# Patient Record
Sex: Male | Born: 1980 | Race: Black or African American | Hispanic: No | Marital: Single | Smoking: Never smoker
Health system: Southern US, Community
[De-identification: ages and names within clinical notes are randomized; demographics above are authoritative.]

## PROBLEM LIST (undated history)

## (undated) DIAGNOSIS — I509 Heart failure, unspecified: Secondary | ICD-10-CM

## (undated) DIAGNOSIS — I890 Lymphedema, not elsewhere classified: Secondary | ICD-10-CM

## (undated) DIAGNOSIS — Z973 Presence of spectacles and contact lenses: Secondary | ICD-10-CM

## (undated) DIAGNOSIS — G4733 Obstructive sleep apnea (adult) (pediatric): Secondary | ICD-10-CM

## (undated) DIAGNOSIS — I517 Cardiomegaly: Secondary | ICD-10-CM

## (undated) DIAGNOSIS — I872 Venous insufficiency (chronic) (peripheral): Secondary | ICD-10-CM

## (undated) DIAGNOSIS — J9690 Respiratory failure, unspecified, unspecified whether with hypoxia or hypercapnia: Secondary | ICD-10-CM

## (undated) DIAGNOSIS — I10 Essential (primary) hypertension: Secondary | ICD-10-CM

## (undated) HISTORY — PX: TRACHEOSTOMY: SUR1362

## (undated) HISTORY — DX: Heart failure, unspecified: I50.9

## (undated) HISTORY — DX: Venous insufficiency (chronic) (peripheral): I87.2

## (undated) HISTORY — DX: Obstructive sleep apnea (adult) (pediatric): G47.33

## (undated) HISTORY — DX: Cardiomegaly: I51.7

## (undated) HISTORY — DX: Presence of spectacles and contact lenses: Z97.3

## (undated) HISTORY — DX: Morbid (severe) obesity due to excess calories: E66.01

## (undated) HISTORY — DX: Lymphedema, not elsewhere classified: I89.0

---
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
## 2006-02-26 ENCOUNTER — Emergency Department (HOSPITAL_COMMUNITY): Admission: EM | Admit: 2006-02-26 | Discharge: 2006-02-26 | Payer: Self-pay | Admitting: Emergency Medicine

## 2006-02-26 ENCOUNTER — Encounter: Payer: Self-pay | Admitting: Vascular Surgery

## 2008-08-22 ENCOUNTER — Encounter (INDEPENDENT_AMBULATORY_CARE_PROVIDER_SITE_OTHER): Payer: Self-pay | Admitting: *Deleted

## 2008-08-22 ENCOUNTER — Inpatient Hospital Stay (HOSPITAL_COMMUNITY): Admission: AD | Admit: 2008-08-22 | Discharge: 2008-10-18 | Payer: Self-pay | Admitting: *Deleted

## 2008-08-22 ENCOUNTER — Ambulatory Visit: Payer: Self-pay | Admitting: Pulmonary Disease

## 2008-08-26 ENCOUNTER — Encounter (INDEPENDENT_AMBULATORY_CARE_PROVIDER_SITE_OTHER): Payer: Self-pay | Admitting: *Deleted

## 2008-09-02 DIAGNOSIS — J9690 Respiratory failure, unspecified, unspecified whether with hypoxia or hypercapnia: Secondary | ICD-10-CM

## 2008-09-02 DIAGNOSIS — I509 Heart failure, unspecified: Secondary | ICD-10-CM

## 2008-09-02 HISTORY — DX: Heart failure, unspecified: I50.9

## 2008-09-02 HISTORY — DX: Respiratory failure, unspecified, unspecified whether with hypoxia or hypercapnia: J96.90

## 2008-09-16 ENCOUNTER — Ambulatory Visit: Payer: Self-pay | Admitting: Physical Medicine & Rehabilitation

## 2008-09-24 ENCOUNTER — Ambulatory Visit: Payer: Self-pay | Admitting: Pulmonary Disease

## 2008-09-30 ENCOUNTER — Ambulatory Visit: Payer: Self-pay | Admitting: Vascular Surgery

## 2008-09-30 ENCOUNTER — Encounter (INDEPENDENT_AMBULATORY_CARE_PROVIDER_SITE_OTHER): Payer: Self-pay | Admitting: Pulmonary Disease

## 2008-10-28 ENCOUNTER — Ambulatory Visit: Payer: Self-pay | Admitting: Pulmonary Disease

## 2008-10-28 DIAGNOSIS — J9610 Chronic respiratory failure, unspecified whether with hypoxia or hypercapnia: Secondary | ICD-10-CM

## 2008-11-27 ENCOUNTER — Ambulatory Visit: Payer: Self-pay | Admitting: Surgery

## 2009-02-25 ENCOUNTER — Ambulatory Visit: Payer: Self-pay | Admitting: Pulmonary Disease

## 2009-02-26 LAB — CONVERTED CEMR LAB
Basophils Absolute: 0.1 10*3/uL (ref 0.0–0.1)
Basophils Relative: 0.9 % (ref 0.0–3.0)
CO2: 32 meq/L (ref 19–32)
Eosinophils Absolute: 0.2 10*3/uL (ref 0.0–0.7)
Glucose, Bld: 87 mg/dL (ref 70–99)
HCT: 40 % (ref 39.0–52.0)
Hemoglobin: 13.8 g/dL (ref 13.0–17.0)
Lymphs Abs: 1.6 10*3/uL (ref 0.7–4.0)
MCHC: 34.4 g/dL (ref 30.0–36.0)
Monocytes Relative: 12.3 % — ABNORMAL HIGH (ref 3.0–12.0)
Neutro Abs: 4.4 10*3/uL (ref 1.4–7.7)
Potassium: 3.3 meq/L — ABNORMAL LOW (ref 3.5–5.1)
RDW: 12.4 % (ref 11.5–14.6)
Sodium: 140 meq/L (ref 135–145)

## 2009-03-02 ENCOUNTER — Encounter: Payer: Self-pay | Admitting: Pulmonary Disease

## 2009-03-03 ENCOUNTER — Encounter: Payer: Self-pay | Admitting: Pulmonary Disease

## 2009-03-04 ENCOUNTER — Telehealth: Payer: Self-pay | Admitting: Pulmonary Disease

## 2009-03-11 ENCOUNTER — Encounter: Payer: Self-pay | Admitting: Pulmonary Disease

## 2009-03-11 ENCOUNTER — Telehealth: Payer: Self-pay | Admitting: Pulmonary Disease

## 2009-05-25 ENCOUNTER — Encounter: Payer: Self-pay | Admitting: Pulmonary Disease

## 2009-06-15 ENCOUNTER — Encounter: Payer: Self-pay | Admitting: Pulmonary Disease

## 2009-06-24 ENCOUNTER — Encounter: Payer: Self-pay | Admitting: Pulmonary Disease

## 2009-09-28 ENCOUNTER — Encounter: Payer: Self-pay | Admitting: Pulmonary Disease

## 2009-09-30 ENCOUNTER — Encounter: Payer: Self-pay | Admitting: Pulmonary Disease

## 2009-11-11 ENCOUNTER — Encounter: Payer: Self-pay | Admitting: Pulmonary Disease

## 2009-11-19 ENCOUNTER — Telehealth: Payer: Self-pay | Admitting: Pulmonary Disease

## 2009-11-25 ENCOUNTER — Ambulatory Visit: Payer: Self-pay | Admitting: Pulmonary Disease

## 2009-11-29 ENCOUNTER — Encounter: Payer: Self-pay | Admitting: Pulmonary Disease

## 2009-12-04 ENCOUNTER — Encounter: Payer: Self-pay | Admitting: Pulmonary Disease

## 2010-01-01 ENCOUNTER — Encounter: Payer: Self-pay | Admitting: Pulmonary Disease

## 2010-01-26 ENCOUNTER — Telehealth (INDEPENDENT_AMBULATORY_CARE_PROVIDER_SITE_OTHER): Payer: Self-pay | Admitting: *Deleted

## 2010-02-04 ENCOUNTER — Ambulatory Visit: Payer: Self-pay | Admitting: Pulmonary Disease

## 2010-02-10 ENCOUNTER — Telehealth: Payer: Self-pay | Admitting: Pulmonary Disease

## 2010-02-18 ENCOUNTER — Encounter: Payer: Self-pay | Admitting: Pulmonary Disease

## 2010-02-26 ENCOUNTER — Encounter: Payer: Self-pay | Admitting: Pulmonary Disease

## 2010-04-08 ENCOUNTER — Ambulatory Visit (HOSPITAL_BASED_OUTPATIENT_CLINIC_OR_DEPARTMENT_OTHER)
Admission: RE | Admit: 2010-04-08 | Discharge: 2010-04-08 | Payer: Self-pay | Source: Home / Self Care | Admitting: Pulmonary Disease

## 2010-04-08 ENCOUNTER — Encounter: Payer: Self-pay | Admitting: Pulmonary Disease

## 2010-04-08 DIAGNOSIS — G4733 Obstructive sleep apnea (adult) (pediatric): Secondary | ICD-10-CM | POA: Insufficient documentation

## 2010-04-15 ENCOUNTER — Telehealth: Payer: Self-pay | Admitting: Pulmonary Disease

## 2010-05-27 IMAGING — CR DG CHEST 1V PORT
1 series · 1 of 1 positions shown · non-contrast
Comparison: 08/28/2008

CLINICAL DATA: Hypoxia.  Chest pain.

PORTABLE CHEST - 1 VIEW

[AP]
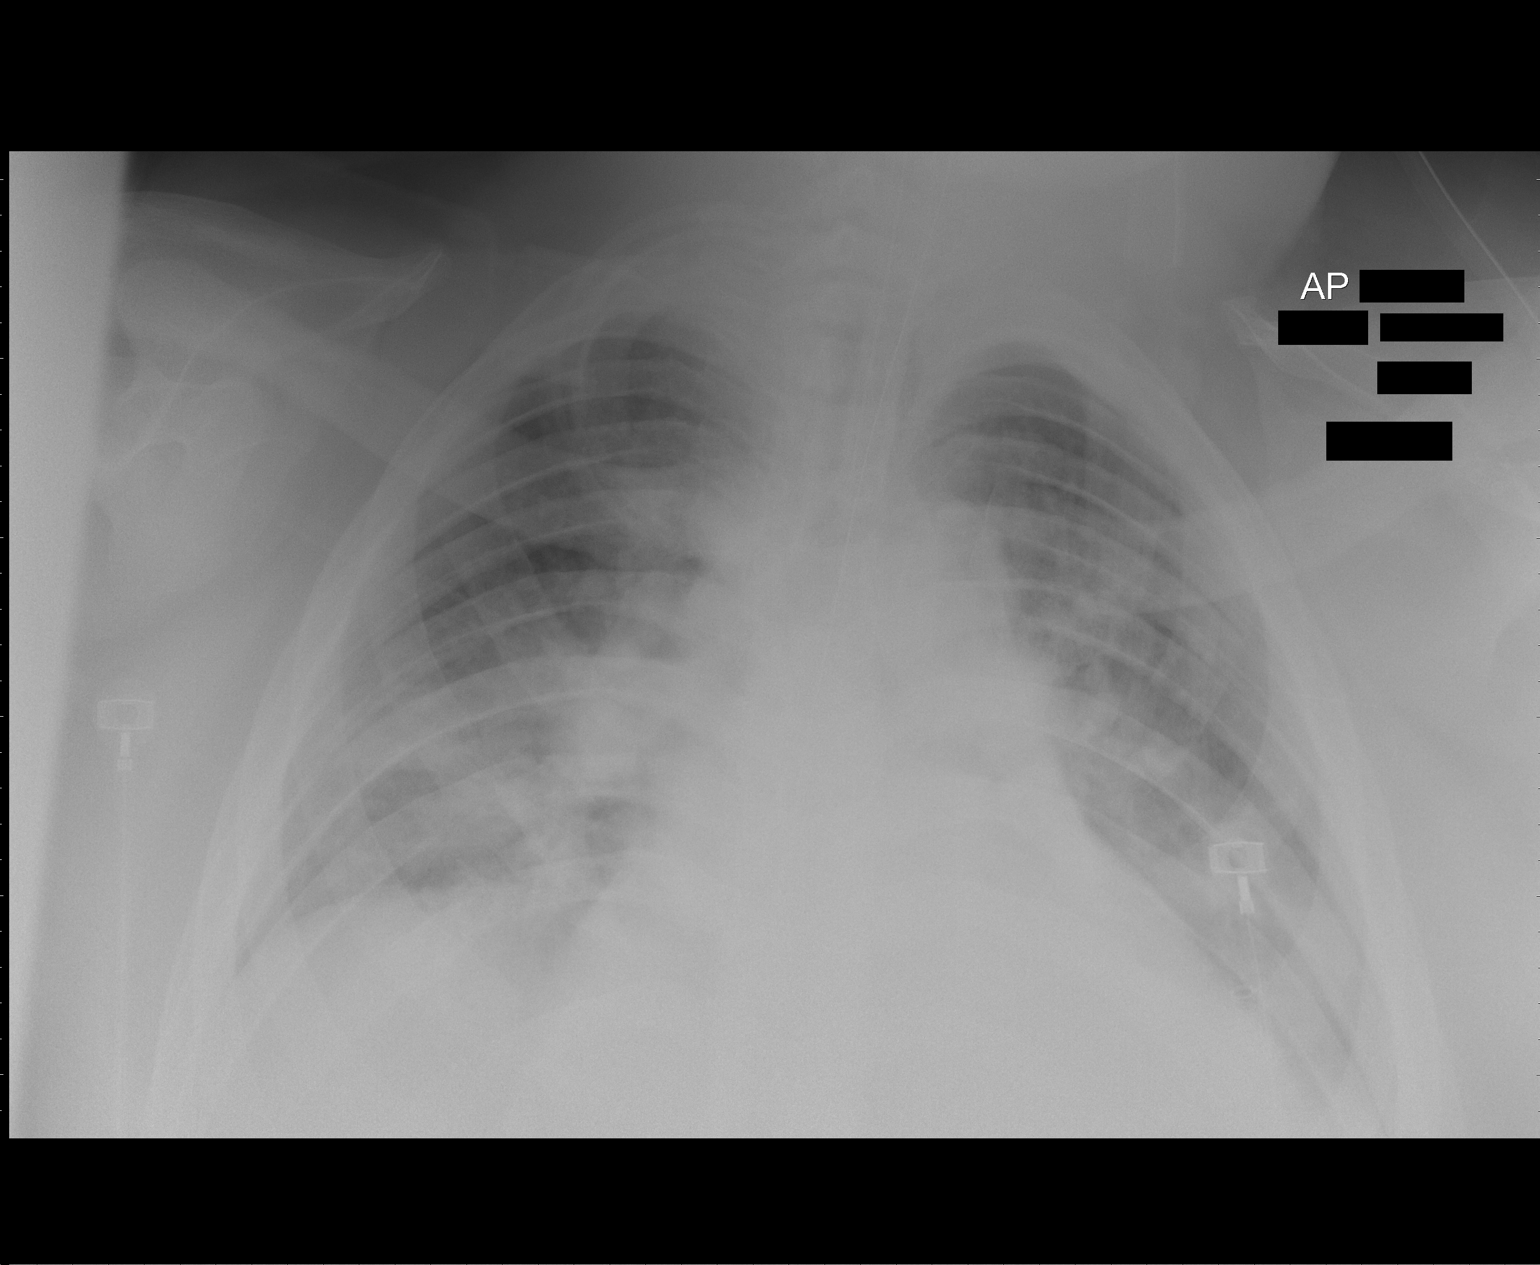

[1 of 1 positions shown; findings below may reference images not displayed]

FINDINGS: Cardiomegaly.  Bilateral patchy lung densities compatible
with atelectasis and possibly some edema.  Left lower lobe
atelectasis / consolidation.  Overall lung aeration minimally
improved.  Satisfactory ET tube position.  NG tube is noted
traversing esophagus.
IMPRESSION: Minimal improved aeration of the lungs.  Cardiomegaly.

## 2010-05-28 IMAGING — CR DG CHEST 1V PORT
1 series · 1 of 1 positions shown · non-contrast
Comparison: 08/29/2008

CLINICAL DATA: Chest pain

PORTABLE CHEST - 1 VIEW

[AP]
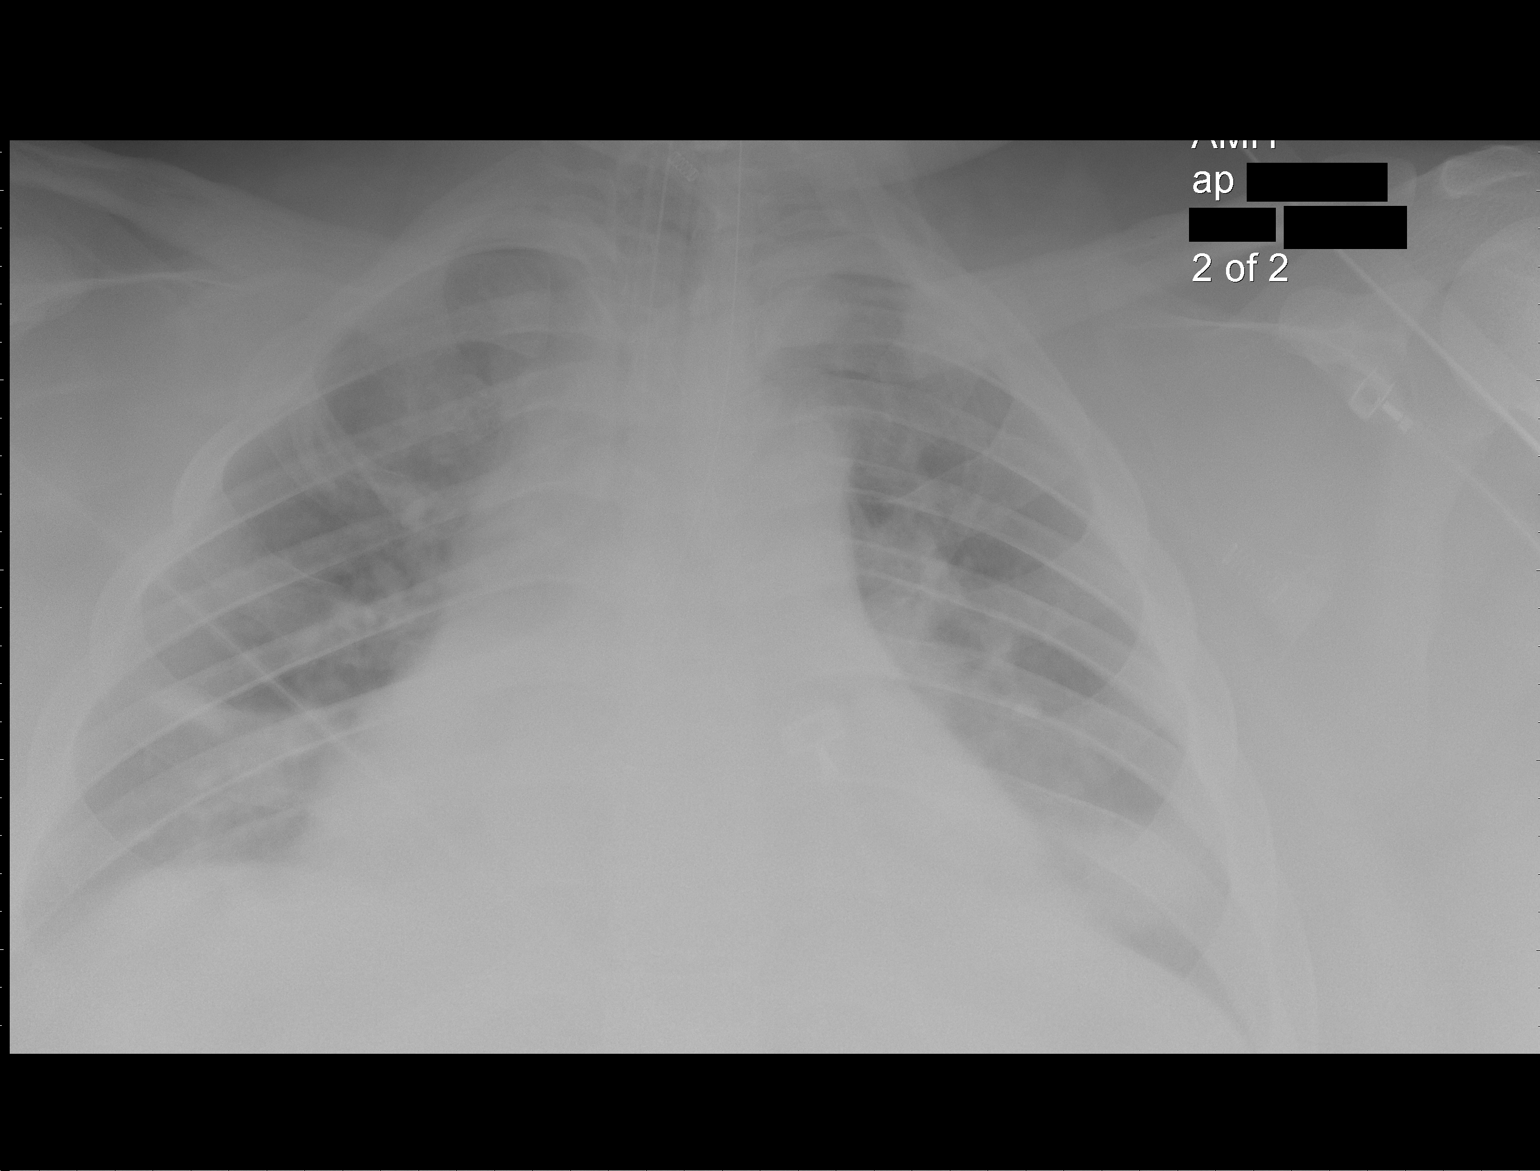

[1 of 1 positions shown; findings below may reference images not displayed]

FINDINGS: Examination is suboptimal due to the patient's body habitus and low
lung volumes. There is an ET tube, the tip appears to be just above
the carina.

A nasogastric tube is identified, I cannot confirm the position of
the tip of the nasogastric tube because of patient's body habitus.

The lung volumes appear lobe.

There is atelectasis in the lung bases.
IMPRESSION: 1.  Poor quality examination that due to the patient's morbid
obesity.
2.  Low lung volumes and bibasilar atelectasis.

## 2010-05-31 IMAGING — CR DG CHEST 1V PORT
1 series · 1 of 1 positions shown · non-contrast
Comparison: Earlier film of the same day

CLINICAL DATA: Chest pain, PICC placement

PORTABLE CHEST - 1 VIEW

[view not recorded]
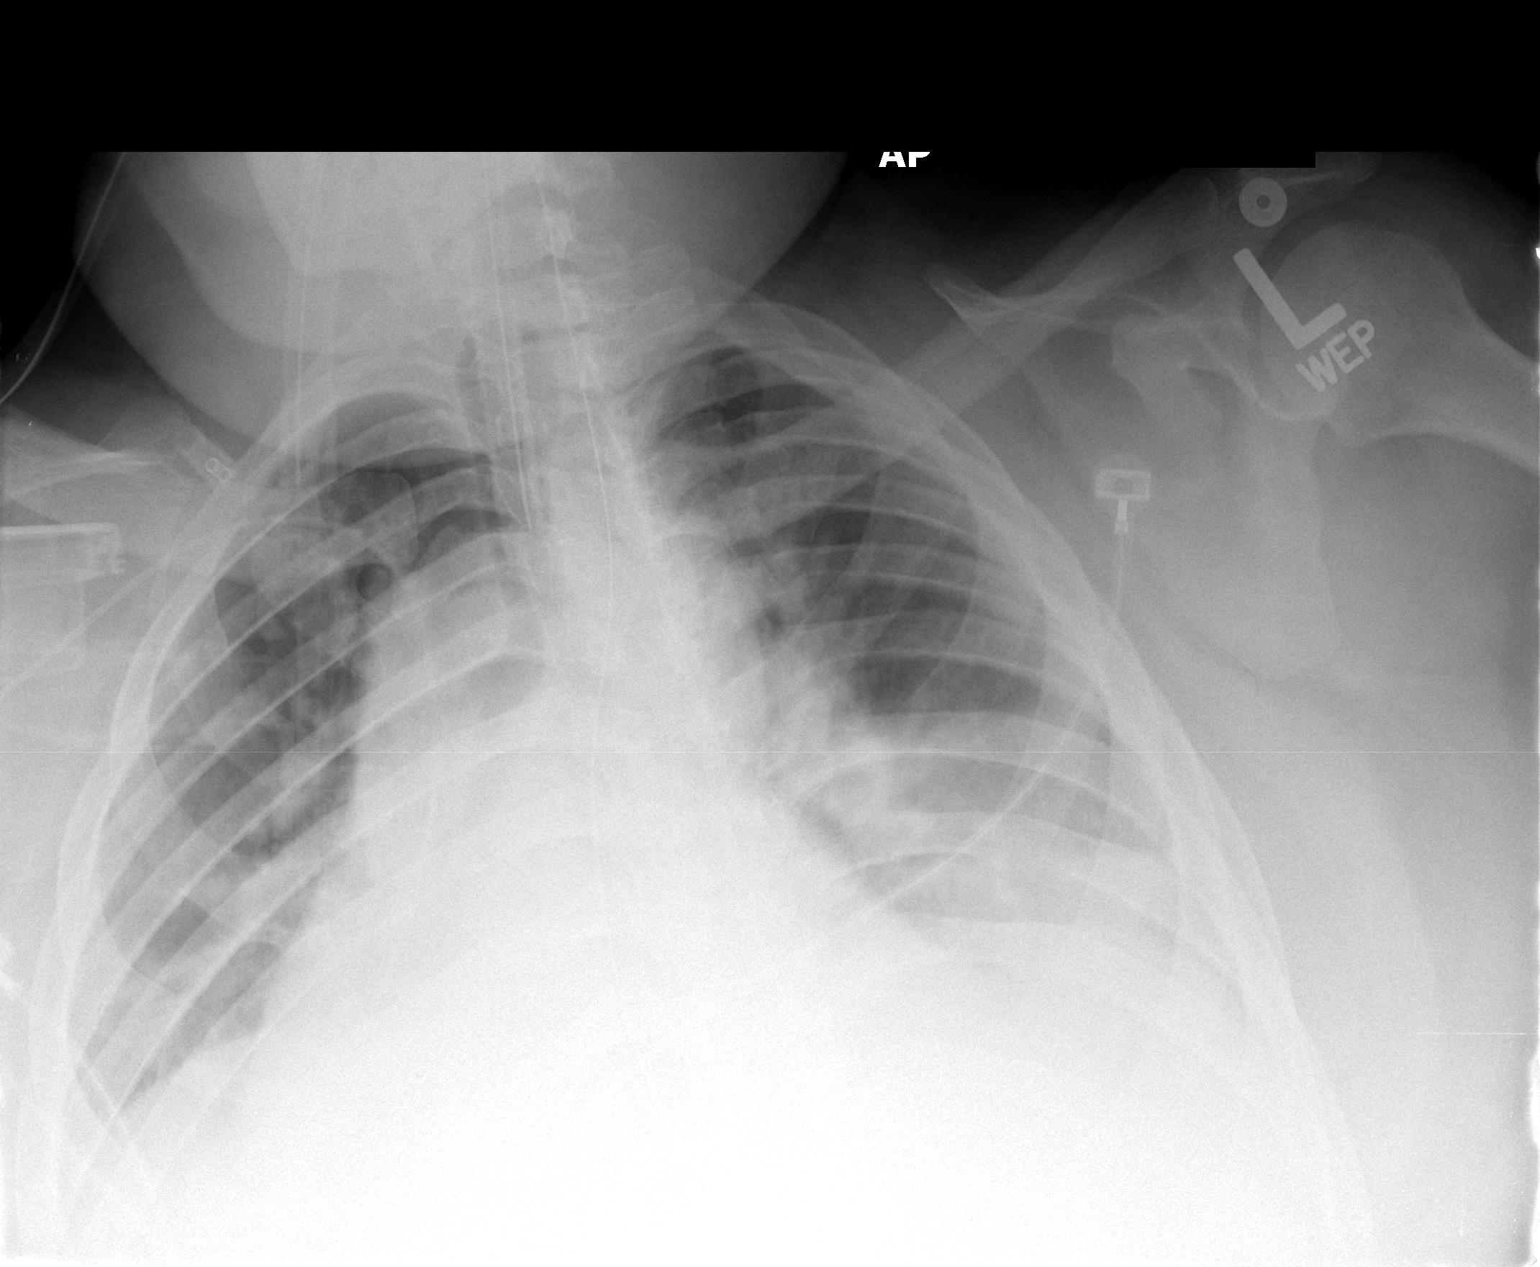

[1 of 1 positions shown; findings below may reference images not displayed]

FINDINGS: Right arm PICC line extends to the cavoatrial junction.
Endotracheal tube and nasogastric tube stable in position.  Low
lung volumes with some increase in bibasilar atelectasis or
consolidation.  Heart size upper limits normal for technique.
Cannot exclude small pleural effusions.
IMPRESSION: 1.  PICC line to cavoatrial junction.
2.  Some interval increase in bibasilar atelectasis or
consolidation.

## 2010-06-02 IMAGING — CR DG CHEST 1V PORT
1 series · 1 of 1 positions shown · non-contrast
Comparison: 09/03/2008

CLINICAL DATA: Hypoxia, chest pain.

PORTABLE CHEST - 1 VIEW

[AP]
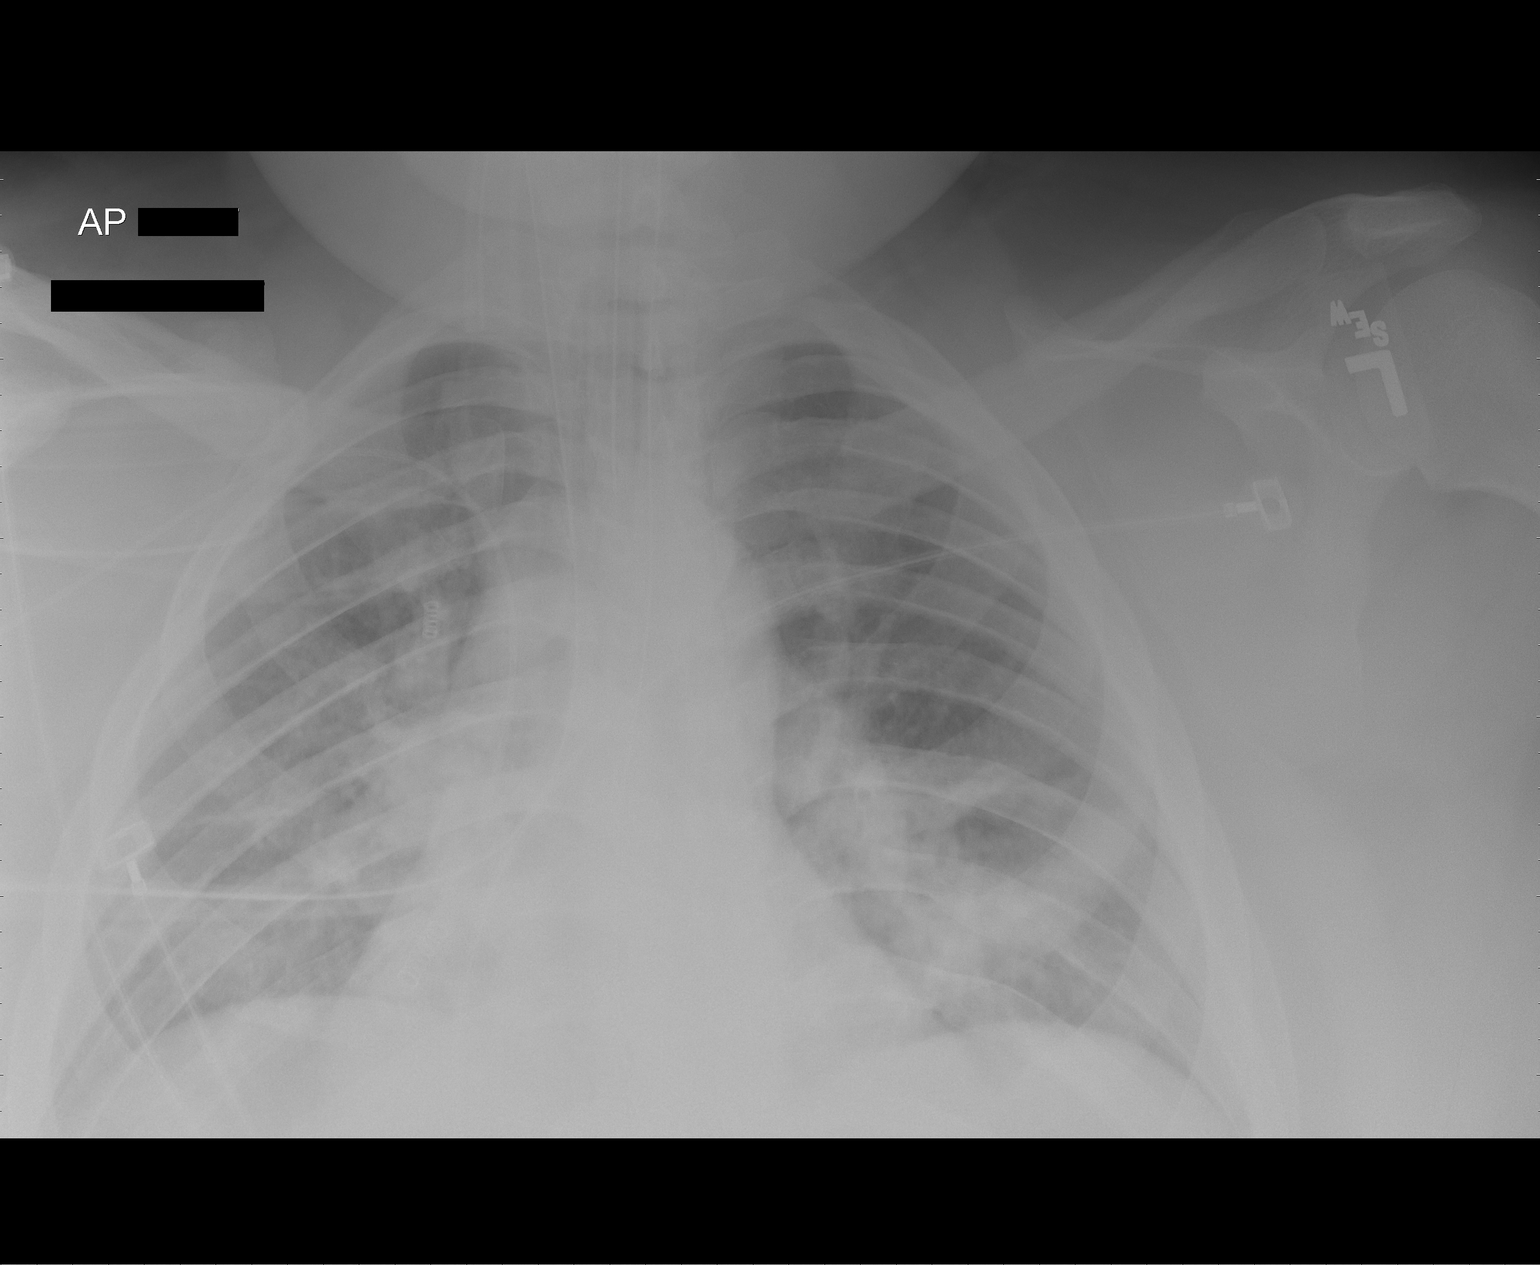

[1 of 1 positions shown; findings below may reference images not displayed]

FINDINGS: Support devices are unchanged.  There is cardiomegaly
with bilateral airspace disease, likely edema.  Slight improvement
in aeration in the lung bases.  No definite effusions.
IMPRESSION: Continued edema.  Slight improved aeration in the bases.

## 2010-06-07 ENCOUNTER — Ambulatory Visit
Admission: RE | Admit: 2010-06-07 | Discharge: 2010-06-07 | Payer: Self-pay | Source: Home / Self Care | Attending: Pulmonary Disease | Admitting: Pulmonary Disease

## 2010-06-10 NOTE — Progress Notes (Signed)
Summary: ONO results  Phone Note Call from Patient   Caller: [email or web address]  Call For: SOOD Summary of Call: pt hasn't been using ventolator about 3mos abd ono shows very little desats what do you want to do Initial call taken by: Oneita Jolly,  January 26, 2010 12:37 PM  Follow-up for Phone Call        He was supposed to schedule an ROV to discuss.  Can we get him scheduled for next available ROV.  Follow-up by: Coralyn Helling MD,  January 26, 2010 2:22 PM  Additional Follow-up for Phone Call Additional follow up Details #1::        Patient is sch fof follow-up on 02/04/2010 @ 4:30pm with Dr. Craige Cotta. A letter had been mailed to the pt and he says he forgot to callback and sch his follow-up. Libby, were ONO results faxed over for Dr. Craige Cotta to review?Michel Bickers [facility]  January 26, 2010 2:48 PM    Additional Follow-up for Phone Call Additional follow up Details #2::    ono will be refaxed and DR V can let [facility] know after his appt 02/04/10 what to do  Follow-up by: Oneita Jolly,  January 26, 2010 3:08 PM  Additional Follow-up for Phone Call Additional follow up Details #3:: Details for Additional Follow-up Action Taken: ONO received and will hold for pt appt with VS on 02/04/2010. Additional Follow-up by: Michel Bickers CMA,  January 26, 2010 4:47 PM

## 2010-06-10 NOTE — Letter (Signed)
SummaryScience writer Pulmonary Care Appointment Letter  [facility]  [address]   [place], [place] [address]   Phone: [phone]  Fax: [phone]    01/01/2010 MRN: [id number]  Tony Mccullough [address] [place], [place]  [address]  Dear [name],   Our office is attempting to contact you about an appointment.  Please call our office at [phone] to schedule this appointment with Dr.___Sood____.  Our registration staff is prepared to assist you with any questions you may have.    Thank you,   Nature conservation officer Pulmonary Division

## 2010-06-10 NOTE — Miscellaneous (Signed)
Summary: Respiratory Equip/[facility]  Respiratory Equip/[facility]   Imported By: Sherian Rein 10/01/2009 15:01:48  _____________________________________________________________________  External Attachment:    Type:   Image     Comment:   External Document

## 2010-06-10 NOTE — Miscellaneous (Signed)
Summary: Request an overnight oximetry/[facility]  Request an overnight oximetry/[facility]   Imported By: Sherian Rein 11/23/2009 11:08:31  _____________________________________________________________________  External Attachment:    Type:   Image     Comment:   External Document

## 2010-06-10 NOTE — Letter (Signed)
Summary: CMN for [facility]  CMN for [facility]   Imported By: Sherian Rein 12/01/2009 08:29:42  _____________________________________________________________________  External Attachment:    Type:   Image     Comment:   External Document

## 2010-06-10 NOTE — Miscellaneous (Signed)
Summary: Plan of Treatment/[facility]  Plan of Treatment/[facility]   Imported By: Sherian Rein 10/09/2009 09:15:55  _____________________________________________________________________  External Attachment:    Type:   Image     Comment:   External Document

## 2010-06-10 NOTE — Progress Notes (Signed)
  Phone Note Call from Patient   Caller: [email or web address]  Call For: sood Summary of Call: need an order to dc vent if pt is going on cpap Initial call taken by: Oneita Jolly,  April 15, 2010 10:32 AM

## 2010-06-10 NOTE — Miscellaneous (Signed)
Summary: Oxygen report  Clinical Lists Changes Oxygen report at rest and with exertion from 02/18/10.  Performed on room air with tracheostomy capped.  Minimal oxygen level 95%.    Results discussed with pt over the phone.  Will discuss results with Dr. Suszanne Conners, and decide about how to further proceed with decannulation and arranging for PAP therapy for his sleep apnea.  Appended Document: Oxygen report Discussed results of oxygen test with Dr. Suszanne Conners.  No contra-indications with plan to do PAP titration and if okay proceed with decannulation.  Plan d/w pt over phone.  Will arrange for PAP titration with trach capped.   Clinical Lists Changes  Orders: Added new Referral order of DME Referral (DME) - Signed

## 2010-06-10 NOTE — Procedures (Signed)
Summary: Oximetry/[facility]  Oximetry/[facility]   Imported By: Sherian Rein 01/04/2010 11:45:49  _____________________________________________________________________  External Attachment:    Type:   Image     Comment:   External Document

## 2010-06-10 NOTE — Progress Notes (Signed)
  Phone Note From Other Clinic   Caller: [facility] Summary of Call: Received request to have ONO done on room air off ventilator.  Will send order through [facility]. Initial call taken by: Coralyn Helling MD,  November 19, 2009 2:00 PM

## 2010-06-10 NOTE — Letter (Signed)
Summary: CMN/[facility]  CMN/[facility]   Imported By: Lester Goodridge 06/22/2009 07:32:05  _____________________________________________________________________  External Attachment:    Type:   Image     Comment:   External Document

## 2010-06-10 NOTE — Assessment & Plan Note (Signed)
Summary: ROV/LC   Visit Type:  Follow-up Copy to:  Newman Pies Primary Provider/Referring Provider:  Peter Swaziland  CC:  Patient is here for follow-up...discuss ONO...no complaints today.  History of Present Illness: 30 yo follow up for his chronic respiratory failure.  He has been doing well.  He has not been using his home vent for the last several months.  He still has his trach.  He has been working 3rd shift.  He gets about 6 hours sleep per day.  He feels okay after waking up.  He is not sleepy during his work shift.  He gets occasional cough.  Otherwise he denies wheeze, sputum, hemoptysis, or chest pain.  He last saw ENT in June and had his trach changed then.  He has lost 40 lbs since June 2010.  Recent overnight oximetry which trach on room air showed minimal oxygen desaturation.  He has a new cell number: [phone].  Current Medications (verified): 1)  Coreg 6.25 Mg Tabs (Carvedilol) .... One Tab Twice A Day 2)  Lasix 40 Mg Tabs (Furosemide) .Marland Kitchen.. 1 Tab Two Times A Day 3)  Albuterol Sulfate (2.5 Mg/10ml) 0.083% Nebu (Albuterol Sulfate) .... As Needed  Allergies (verified): No Known Drug Allergies  Past History:  Past Surgical History: Last updated: 10/28/2008 Tracheostomy September 04, 2008 by Dr. Newman Pies  Past Medical History: Chronic hypoxic and hypercapneic respiratory failure      - s/p tracheostomy Morbid obesity Hypertension Grade 2 diastolic dysfunction      - Echo August 26, 2008 EF 65% Diabetes mellitus Right upper arm DVT 09/30/08  Vital Signs:  Patient profile:   30 year old male Height:      73 inches (185.42 cm) Weight:      368 pounds (167.27 kg) BMI:     48.73 O2 Sat:      97 % on Room air Temp:     97.9 degrees F (36.61 degrees C) oral Pulse rate:   78 / minute BP sitting:   132 / 84  (left arm) Cuff size:   large  Vitals Entered By: Michel Bickers CMA (February 04, 2010 4:44 PM)  O2 Sat at Rest %:  97 O2 Flow:  Room air CC: Patient is here for  follow-up...discuss ONO...no complaints today Comments Medications reviewed with patient Michel Bickers [facility]  February 04, 2010 4:45 PM   Physical Exam  General:  healthy appearing and obese.   Nose:  no deformity, discharge, inflammation, or lesions Mouth:  MP 4, no oral lesion Neck:  trach site clean Lungs:  decreased breath sounds, no wheezing Heart:  regular rate and rhythm, S1, S2 without murmurs, rubs, gallops, or clicks Extremities:  no clubbing, cyanosis, edema, or deformity noted Neurologic:  normal CN II-XII and strength normal.   Cervical Nodes:  no significant adenopathy Psych:  alert and cooperative; normal mood and affect; normal attention span and concentration   Impression & Recommendations:  Problem # 1:  CHRONIC RESPIRATORY FAILURE (QIH-474.25) He has improved.  WIll see if he can get set up with CPAP/BPAP.  If he is able to do this he may be able to get decannulation of tracheostomy.  Problem # 2:  MORBID OBESITY (ICD-278.01) Encouraged him to continue with weight loss.  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  Complete Medication List: 1)  Coreg 6.25 Mg Tabs (Carvedilol) .... One tab twice a day 2)  Lasix 40  Mg Tabs (Furosemide) .Marland Kitchen.. 1 tab two times a day 3)  Albuterol Sulfate (2.5 Mg/30ml) 0.083% Nebu (Albuterol sulfate) .... As needed  Other Orders: Est. Patient Level III (413[address])  Patient Instructions: 1)  Will discuss tracheostomy with Dr. Suszanne Conners 2)  Follow up in 2 months

## 2010-06-10 NOTE — Letter (Signed)
Summary: DME/[facility]  DME/[facility]   Imported By: Lester Everly 06/22/2009 07:29:42  _____________________________________________________________________  External Attachment:    Type:   Image     Comment:   External Document

## 2010-06-10 NOTE — Assessment & Plan Note (Signed)
Summary: rov/mbw   Copy to:  Newman Pies Primary Provider/Referring Provider:  Peter Swaziland  CC:  Pt would like to see about getting the trach out, pt states he has no concerns, and overall pt states he is doing fine.  History of Present Illness: 30 yo follow up for his chronic respiratory failure.  He is using his ventilator about once per week.  He usually sleeps with his trach uncapped, but off the vent.  He feels he has been doing okay.  He is working third shift.  He continues to exercise at the gym, and has been modifying his diet.  His weight has been steady.  He was recently seen by ENT and had his trach changed.   Preventive Screening-Counseling & Management  Alcohol-Tobacco     Smoking Status: never  Current Medications (verified): 1)  Coreg 6.25 Mg Tabs (Carvedilol) .... One Tab Twice A Day 2)  Lasix 40 Mg Tabs (Furosemide) .Marland Kitchen.. 1 Tab Two Times A Day 3)  Albuterol Sulfate (2.5 Mg/42ml) 0.083% Nebu (Albuterol Sulfate) .... As Needed  Allergies (verified): No Known Drug Allergies  Past History:  Past Medical History: Reviewed history from 10/28/2008 and no changes required. Chronic hypoxic and hypercapneic respiratory failure      - s/p tracheostomy and nocturnal ventilation Morbid obesity Hypertension Grade 2 diastolic dysfunction      - Echo August 26, 2008 EF 65% Diabetes mellitus Right upper arm DVT 09/30/08  Past Surgical History: Reviewed history from 10/28/2008 and no changes required. Tracheostomy September 04, 2008 by Dr. Newman Pies  Vital Signs:  Patient profile:   30 year old male Height:      73 inches Weight:      372.4 pounds BMI:     49.31 O2 Sat:      94 % on Room air Temp:     98.4 degrees F oral Pulse rate:   81 / minute BP sitting:   130 / 70  (left arm) Cuff size:   large  Vitals Entered By: Carver Fila (November 25, 2009 9:57 AM)  O2 Flow:  Room air CC: Pt would like to see about getting the trach out, pt states he has no concerns, overall pt  states he is doing fine Is Patient Diabetic? No Comments meds and allergies updated Phone number updated Carver Fila  November 25, 2009 9:59 AM    Physical Exam  General:  healthy appearing and obese.   Nose:  no deformity, discharge, inflammation, or lesions Mouth:  MP 4, no oral lesion Neck:  trach site clean Lungs:  decreased breath sounds, no wheezing Heart:  regular rate and rhythm, S1, S2 without murmurs, rubs, gallops, or clicks Extremities:  no clubbing, cyanosis, edema, or deformity noted Cervical Nodes:  no significant adenopathy   Impression & Recommendations:  Problem # 1:  CHRONIC RESPIRATORY FAILURE (ICD-518.83) On the basis of OSA and OHS.  He has improved clinically with weight loss.  Will arrange for ONO on room air without vent and trach uncapped.  Will also check BMET. He has not been using his ventilator consistently, but has been doing okay.  Will check his overnight oximetry to see if he can d/c home vent.  If this is okay, will then arrange for further testing to see if he can use CPAP/BPAP and then have trach decannulated.   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  Problem # 3:  MORBID OBESITY (ICD-278.01)  Encouraged him  to continue with his weight loss.  Medications Added to Medication List This Visit: 1)  Coreg 6.25 Mg Tabs (Carvedilol) .... One tab twice a day 2)  Lasix 40 Mg Tabs (Furosemide) .Marland Kitchen.. 1 tab two times a day  Complete Medication List: 1)  Coreg 6.25 Mg Tabs (Carvedilol) .... One tab twice a day 2)  Lasix 40 Mg Tabs (Furosemide) .Marland Kitchen.. 1 tab two times a day 3)  Albuterol Sulfate (2.5 Mg/31ml) 0.083% Nebu (Albuterol sulfate) .... As needed  Other Orders: Est. Patient Level III (41324)  Patient Instructions: 1)  Will call with results of oxygen test 2)  Follow up in 2 months   Immunization History:  Pneumovax Immunization History:    Pneumovax:  historical (09/06/2008)

## 2010-06-10 NOTE — Progress Notes (Signed)
  Phone Note Outgoing Call   Call placed by: Coralyn Helling MD,  February 10, 2010 3:10 PM Call placed to: Patient Summary of Call: Spoke with Dr. Suszanne Conners earlier in the week about decannulation process.  He is in favor of this.  Will have DME set up cap of trach during the day.  If he does okay with this will proceed with CPAP/BPAP titration study with trach capped.  If okay, will then consider decannulation.  Advised him to call Dr. Suszanne Conners or our office if there is any difficulty with this. Initial call taken by: Coralyn Helling MD,  February 10, 2010 3:15 PM

## 2010-06-10 NOTE — Miscellaneous (Signed)
Summary: Room air overnight oximetry  Clinical Lists Changes Test time 4hrs 42 min.  Mean SpO2 94.5%, low 74%.  Spent 36 sec (6.6%) with SpO2 < 88%.  Will have my nurse call to schedule next available ROV to discuss results.  Appended Document: Room air overnight oximetry Attempt to call pt but was unable to get in touch with him. No VM to leave message so  Will try back later.  Appended Document: Room air overnight oximetry LMOMTCB.  Appended Document: Room air overnight oximetry Letter will be sent to patient requesting that he call for an appt.

## 2010-06-10 NOTE — Letter (Signed)
Summary: [facility]  [facility]   Imported By: Lester Monmouth 06/02/2009 08:09:58  _____________________________________________________________________  External Attachment:    Type:   Image     Comment:   External Document

## 2010-06-10 NOTE — Miscellaneous (Signed)
Summary: CPAP titration   Clinical Lists Changes Test performed with tracheostomy cuff deflated, and tracheostomy capped.  Titrate to CPAP 11 cm H2O with AHI down to 3.9.  Observed in REM and supine sleep.  Done w/o use of supplemental oxygen.  Results d/w pt over the phone.  Will proceed with CPAP set up.  He is worried about the process of decannulation, and has requested that Dr. Suszanne Conners evaluate for decannulation.  Will contact Dr. Suszanne Conners to arrange this.    Will have my nurse call to schedule ROV in 6 to 8 weeks to assess CPAP set up. Problems: Added new problem of OBSTRUCTIVE SLEEP APNEA (ICD-327.23) Orders: Added new Referral order of DME Referral (DME) - Signed  Appended Document: CPAP titration  Plan d/w Dr. Suszanne Conners.  He will contact Mr. Forbush to proceed with decannulation.  Appended Document: CPAP titration  Pt will f/u with VS on 1/30 @ 2:15 to discuss cpap use.

## 2010-06-10 NOTE — Miscellaneous (Signed)
Summary: Respiratory Eval/[facility]  Respiratory Eval/[facility]   Imported By: Sherian Rein 03/10/2010 09:18:35  _____________________________________________________________________  External Attachment:    Type:   Image     Comment:   External Document

## 2010-06-10 NOTE — Letter (Signed)
Summary: CMN/[facility]  CMN/[facility]   Imported By: Lester Dellroy 06/22/2009 07:30:53  _____________________________________________________________________  External Attachment:    Type:   Image     Comment:   External Document

## 2010-06-10 NOTE — Letter (Signed)
Summary: CMN for Oxygen/[facility]  CMN for Oxygen/[facility]   Imported By: Sherian Rein 06/30/2009 08:43:31  _____________________________________________________________________  External Attachment:    Type:   Image     Comment:   External Document

## 2010-06-16 NOTE — Assessment & Plan Note (Signed)
Summary: 6-8 wk cpap f/u/LC   Copy to:  Newman Pies Primary Provider/Referring Provider:  Peter Swaziland  CC:  follow up. Pt is here to be restarted back on cpap after being taken off the vent.  History of Present Illness: 30 yo follow up for his chronic respiratory failure.  He had his trach taken out in December before Christmas.  He has not received his CPAP machine yet.  He wanted to wait until after his trach stoma healed.  He had his CPAP titration on Apr 08, 2010.  He did well with CPAP 11 cm.  His breathing has been doing okay.  He still has occasional leg swelling.  He has a new cell number: [phone].  Current Medications (verified): 1)  Coreg 6.25 Mg Tabs (Carvedilol) .... One Tab Twice A Day 2)  Lasix 40 Mg Tabs (Furosemide) .Marland Kitchen.. 1 Tab Two Times A Day 3)  Albuterol Sulfate (2.5 Mg/91ml) 0.083% Nebu (Albuterol Sulfate) .... As Needed  Allergies (verified): No Known Drug Allergies  Past History:  Past Medical History: Chronic hypoxic and hypercapneic respiratory failure      - s/p tracheostomy>>decannulated Dec. 2011 OSA     - CPAP 11 cm Morbid obesity Hypertension Grade 2 diastolic dysfunction      - Echo August 26, 2008 EF 65% Diabetes mellitus Right upper arm DVT 09/30/08  Past Surgical History: Reviewed history from 10/28/2008 and no changes required. Tracheostomy September 04, 2008 by Dr. Newman Pies  Vital Signs:  Patient profile:   30 year old male Height:      73 inches Weight:      389.38 pounds BMI:     51.56 O2 Sat:      98 % on Room air Temp:     97.8 degrees F oral Pulse rate:   85 / minute BP sitting:   110 / 72  (left arm) Cuff size:   large  Vitals Entered By: Carver Fila (June 07, 2010 2:20 PM)  O2 Flow:  Room air CC: follow up. Pt is here to be restarted back on cpap after being taken off the vent Comments meds and allergies updated Phone number updated Carver Fila  June 07, 2010 2:21 PM    Physical Exam  General:  healthy  appearing and obese.   Nose:  no deformity, discharge, inflammation, or lesions Mouth:  MP 4, no oral lesion Neck:  trach site clean and healing well Lungs:  decreased breath sounds, no wheezing Heart:  regular rate and rhythm, S1, S2 without murmurs, rubs, gallops, or clicks Extremities:  no clubbing, cyanosis, edema, or deformity noted Neurologic:  normal CN II-XII and strength normal.   Cervical Nodes:  no significant adenopathy Psych:  alert and cooperative; normal mood and affect; normal attention span and concentration   Impression & Recommendations:  Problem # 1:  OBSTRUCTIVE SLEEP APNEA (ICD-327.23)  He is to start CPAP.  Problem # 2:  MORBID OBESITY (ICD-278.01)  Explained the importance of maintaining his weight.  Problem # 3:  TRACHEOSTOMY STATUS (ICD-V44.0) He has been decannulated.  He has f/u with ENT in Feb.  Complete Medication List: 1)  Coreg 6.25 Mg Tabs (Carvedilol) .... One tab twice a day 2)  Lasix 40 Mg Tabs (Furosemide) .Marland Kitchen.. 1 tab two times a day 3)  Albuterol Sulfate (2.5 Mg/30ml) 0.083% Nebu (Albuterol sulfate) .... As needed  Other Orders: Est. Patient Level III (09811)  Patient Instructions: 1)  Follow up in May 2012

## 2010-08-16 LAB — PROTIME-INR
INR: 2.3 — ABNORMAL HIGH (ref 0.00–1.49)
INR: 2.7 — ABNORMAL HIGH (ref 0.00–1.49)
Prothrombin Time: 30.3 seconds — ABNORMAL HIGH (ref 11.6–15.2)
Prothrombin Time: 33.3 seconds — ABNORMAL HIGH (ref 11.6–15.2)
Prothrombin Time: 38.8 seconds — ABNORMAL HIGH (ref 11.6–15.2)

## 2010-08-16 LAB — BASIC METABOLIC PANEL
BUN: 8 mg/dL (ref 6–23)
BUN: 8 mg/dL (ref 6–23)
BUN: 8 mg/dL (ref 6–23)
CO2: 31 mEq/L (ref 19–32)
CO2: 32 mEq/L (ref 19–32)
CO2: 33 mEq/L — ABNORMAL HIGH (ref 19–32)
Calcium: 9.1 mg/dL (ref 8.4–10.5)
Calcium: 9.2 mg/dL (ref 8.4–10.5)
Chloride: 100 mEq/L (ref 96–112)
Chloride: 102 mEq/L (ref 96–112)
Chloride: 95 mEq/L — ABNORMAL LOW (ref 96–112)
Chloride: 97 mEq/L (ref 96–112)
GFR calc Af Amer: >60 mL/min (ref >60)
GFR calc non Af Amer: >60 mL/min (ref >60)
GFR calc non Af Amer: >60 mL/min (ref >60)
Glucose, Bld: 100 mg/dL — ABNORMAL HIGH (ref 70–99)
Glucose, Bld: 102 mg/dL — ABNORMAL HIGH (ref 70–99)
Glucose, Bld: 106 mg/dL — ABNORMAL HIGH (ref 70–99)
Glucose, Bld: 134 mg/dL — ABNORMAL HIGH (ref 70–99)
Glucose, Bld: 89 mg/dL (ref 70–99)
Potassium: 3.5 mEq/L (ref 3.5–5.1)
Potassium: 3.8 mEq/L (ref 3.5–5.1)
Potassium: 4 mEq/L (ref 3.5–5.1)
Potassium: 4 mEq/L (ref 3.5–5.1)
Sodium: 135 mEq/L (ref 135–145)
Sodium: 136 mEq/L (ref 135–145)
Sodium: 137 mEq/L (ref 135–145)
Sodium: 138 mEq/L (ref 135–145)
Sodium: 140 mEq/L (ref 135–145)

## 2010-08-16 LAB — CBC
HCT: 35 % — ABNORMAL LOW (ref 39.0–52.0)
HCT: 38.1 % — ABNORMAL LOW (ref 39.0–52.0)
HCT: 39.2 % (ref 39.0–52.0)
Hemoglobin: 12.6 g/dL — ABNORMAL LOW (ref 13.0–17.0)
Hemoglobin: 12.7 g/dL — ABNORMAL LOW (ref 13.0–17.0)
Hemoglobin: 12.7 g/dL — ABNORMAL LOW (ref 13.0–17.0)
Hemoglobin: 13.1 g/dL (ref 13.0–17.0)
Hemoglobin: 13.6 g/dL (ref 13.0–17.0)
MCHC: 34.8 g/dL (ref 30.0–36.0)
MCHC: 35.1 g/dL (ref 30.0–36.0)
MCHC: 36 g/dL (ref 30.0–36.0)
MCV: 92.4 fL (ref 78.0–100.0)
MCV: 93.1 fL (ref 78.0–100.0)
MCV: 93.6 fL (ref 78.0–100.0)
MCV: 94.5 fL (ref 78.0–100.0)
Platelets: 192 10*3/uL (ref 150–400)
RBC: 3.98 MIL/uL — ABNORMAL LOW (ref 4.22–5.81)
RBC: 4.19 MIL/uL — ABNORMAL LOW (ref 4.22–5.81)
RBC: 4.24 MIL/uL (ref 4.22–5.81)
RDW: 18.6 % — ABNORMAL HIGH (ref 11.5–15.5)
RDW: 18.6 % — ABNORMAL HIGH (ref 11.5–15.5)
RDW: 19.1 % — ABNORMAL HIGH (ref 11.5–15.5)
WBC: 5.9 10*3/uL (ref 4.0–10.5)
WBC: 6.7 10*3/uL (ref 4.0–10.5)

## 2010-08-17 LAB — BASIC METABOLIC PANEL
BUN: 10 mg/dL (ref 6–23)
BUN: 7 mg/dL (ref 6–23)
BUN: 7 mg/dL (ref 6–23)
BUN: 8 mg/dL (ref 6–23)
BUN: 11 mg/dL (ref 6–23)
BUN: 16 mg/dL (ref 6–23)
BUN: 29 mg/dL — ABNORMAL HIGH (ref 6–23)
BUN: 33 mg/dL — ABNORMAL HIGH (ref 6–23)
BUN: 37 mg/dL — ABNORMAL HIGH (ref 6–23)
CO2: 32 mEq/L (ref 19–32)
CO2: 33 mEq/L — ABNORMAL HIGH (ref 19–32)
CO2: 36 mEq/L — ABNORMAL HIGH (ref 19–32)
CO2: 37 mEq/L — ABNORMAL HIGH (ref 19–32)
CO2: 37 mEq/L — ABNORMAL HIGH (ref 19–32)
CO2: 38 mEq/L — ABNORMAL HIGH (ref 19–32)
CO2: 39 mEq/L — ABNORMAL HIGH (ref 19–32)
CO2: 39 mEq/L — ABNORMAL HIGH (ref 19–32)
Calcium: 9.1 mg/dL (ref 8.4–10.5)
Calcium: 9.3 mg/dL (ref 8.4–10.5)
Calcium: 9 mg/dL (ref 8.4–10.5)
Calcium: 9 mg/dL (ref 8.4–10.5)
Calcium: 9.4 mg/dL (ref 8.4–10.5)
Chloride: 100 mEq/L (ref 96–112)
Chloride: 101 mEq/L (ref 96–112)
Chloride: 91 mEq/L — ABNORMAL LOW (ref 96–112)
Chloride: 102 mEq/L (ref 96–112)
Chloride: 105 mEq/L (ref 96–112)
Chloride: 107 mEq/L (ref 96–112)
Chloride: 110 mEq/L (ref 96–112)
Chloride: 88 mEq/L — ABNORMAL LOW (ref 96–112)
Chloride: 95 mEq/L — ABNORMAL LOW (ref 96–112)
Chloride: 95 mEq/L — ABNORMAL LOW (ref 96–112)
Chloride: 96 mEq/L (ref 96–112)
Chloride: 99 mEq/L (ref 96–112)
Creatinine, Ser: 0.78 mg/dL (ref 0.4–1.5)
Creatinine, Ser: 0.84 mg/dL (ref 0.4–1.5)
Creatinine, Ser: 0.98 mg/dL (ref 0.4–1.5)
Creatinine, Ser: 0.98 mg/dL (ref 0.4–1.5)
Creatinine, Ser: 1.31 mg/dL (ref 0.4–1.5)
Creatinine, Ser: 1.54 mg/dL — ABNORMAL HIGH (ref 0.4–1.5)
Creatinine, Ser: 1.55 mg/dL — ABNORMAL HIGH (ref 0.4–1.5)
Creatinine, Ser: 1.68 mg/dL — ABNORMAL HIGH (ref 0.4–1.5)
GFR calc Af Amer: >60 mL/min (ref >60)
GFR calc Af Amer: >60 mL/min (ref >60)
GFR calc Af Amer: >60 mL/min (ref >60)
GFR calc Af Amer: >60 mL/min (ref >60)
GFR calc Af Amer: >60 mL/min (ref >60)
GFR calc Af Amer: >60 mL/min (ref >60)
GFR calc Af Amer: >60 mL/min (ref >60)
GFR calc non Af Amer: >60 mL/min (ref >60)
GFR calc non Af Amer: >60 mL/min (ref >60)
GFR calc non Af Amer: >60 mL/min (ref >60)
GFR calc non Af Amer: >60 mL/min (ref >60)
GFR calc non Af Amer: 46 mL/min — ABNORMAL LOW (ref >60)
GFR calc non Af Amer: 47 mL/min — ABNORMAL LOW (ref >60)
GFR calc non Af Amer: 49 mL/min — ABNORMAL LOW (ref >60)
GFR calc non Af Amer: 54 mL/min — ABNORMAL LOW (ref >60)
GFR calc non Af Amer: >60 mL/min (ref >60)
GFR calc non Af Amer: >60 mL/min (ref >60)
Glucose, Bld: 102 mg/dL — ABNORMAL HIGH (ref 70–99)
Glucose, Bld: 103 mg/dL — ABNORMAL HIGH (ref 70–99)
Glucose, Bld: 98 mg/dL (ref 70–99)
Glucose, Bld: 106 mg/dL — ABNORMAL HIGH (ref 70–99)
Glucose, Bld: 109 mg/dL — ABNORMAL HIGH (ref 70–99)
Glucose, Bld: 110 mg/dL — ABNORMAL HIGH (ref 70–99)
Glucose, Bld: 118 mg/dL — ABNORMAL HIGH (ref 70–99)
Glucose, Bld: 217 mg/dL — ABNORMAL HIGH (ref 70–99)
Glucose, Bld: 97 mg/dL (ref 70–99)
Potassium: 3.7 mEq/L (ref 3.5–5.1)
Potassium: 3.8 mEq/L (ref 3.5–5.1)
Potassium: 3.9 mEq/L (ref 3.5–5.1)
Potassium: 4 mEq/L (ref 3.5–5.1)
Potassium: 4.2 mEq/L (ref 3.5–5.1)
Potassium: 3.7 mEq/L (ref 3.5–5.1)
Potassium: 3.8 mEq/L (ref 3.5–5.1)
Potassium: 3.8 mEq/L (ref 3.5–5.1)
Potassium: 3.9 mEq/L (ref 3.5–5.1)
Potassium: 4.2 mEq/L (ref 3.5–5.1)
Sodium: 136 mEq/L (ref 135–145)
Sodium: 139 mEq/L (ref 135–145)
Sodium: 134 mEq/L — ABNORMAL LOW (ref 135–145)
Sodium: 139 mEq/L (ref 135–145)
Sodium: 142 mEq/L (ref 135–145)
Sodium: 144 mEq/L (ref 135–145)
Sodium: 157 mEq/L — ABNORMAL HIGH (ref 135–145)

## 2010-08-17 LAB — CBC
HCT: 36.1 % — ABNORMAL LOW (ref 39.0–52.0)
HCT: 36.2 % — ABNORMAL LOW (ref 39.0–52.0)
HCT: 36.3 % — ABNORMAL LOW (ref 39.0–52.0)
HCT: 36.6 % — ABNORMAL LOW (ref 39.0–52.0)
HCT: 41.8 % (ref 39.0–52.0)
HCT: 44.5 % (ref 39.0–52.0)
HCT: 45 % (ref 39.0–52.0)
HCT: 45.8 % (ref 39.0–52.0)
HCT: 54.1 % — ABNORMAL HIGH (ref 39.0–52.0)
Hemoglobin: 12.5 g/dL — ABNORMAL LOW (ref 13.0–17.0)
Hemoglobin: 12.9 g/dL — ABNORMAL LOW (ref 13.0–17.0)
Hemoglobin: 13 g/dL (ref 13.0–17.0)
Hemoglobin: 14 g/dL (ref 13.0–17.0)
Hemoglobin: 14.2 g/dL (ref 13.0–17.0)
Hemoglobin: 14.4 g/dL (ref 13.0–17.0)
Hemoglobin: 14.8 g/dL (ref 13.0–17.0)
Hemoglobin: 16.4 g/dL (ref 13.0–17.0)
MCHC: 34.2 g/dL (ref 30.0–36.0)
MCHC: 34.7 g/dL (ref 30.0–36.0)
MCHC: 35.5 g/dL (ref 30.0–36.0)
MCHC: 31.5 g/dL (ref 30.0–36.0)
MCHC: 32 g/dL (ref 30.0–36.0)
MCHC: 32 g/dL (ref 30.0–36.0)
MCHC: 32.3 g/dL (ref 30.0–36.0)
MCHC: 33.5 g/dL (ref 30.0–36.0)
MCHC: 33.5 g/dL (ref 30.0–36.0)
MCHC: 33.8 g/dL (ref 30.0–36.0)
MCV: 92.2 fL (ref 78.0–100.0)
MCV: 93.3 fL (ref 78.0–100.0)
MCV: 93.4 fL (ref 78.0–100.0)
MCV: 90.3 fL (ref 78.0–100.0)
MCV: 90.6 fL (ref 78.0–100.0)
MCV: 90.8 fL (ref 78.0–100.0)
MCV: 90.8 fL (ref 78.0–100.0)
MCV: 91.3 fL (ref 78.0–100.0)
MCV: 92.1 fL (ref 78.0–100.0)
MCV: 92.4 fL (ref 78.0–100.0)
MCV: 92.6 fL (ref 78.0–100.0)
MCV: 92.8 fL (ref 78.0–100.0)
MCV: 93.3 fL (ref 78.0–100.0)
Platelets: 186 10*3/uL (ref 150–400)
Platelets: 187 10*3/uL (ref 150–400)
Platelets: 187 10*3/uL (ref 150–400)
Platelets: 199 10*3/uL (ref 150–400)
Platelets: 184 10*3/uL (ref 150–400)
Platelets: 199 10*3/uL (ref 150–400)
Platelets: 223 10*3/uL (ref 150–400)
Platelets: 240 10*3/uL (ref 150–400)
Platelets: 251 10*3/uL (ref 150–400)
Platelets: 259 10*3/uL (ref 150–400)
Platelets: 279 10*3/uL (ref 150–400)
RBC: 3.66 MIL/uL — ABNORMAL LOW (ref 4.22–5.81)
RBC: 3.89 MIL/uL — ABNORMAL LOW (ref 4.22–5.81)
RBC: 4.01 MIL/uL — ABNORMAL LOW (ref 4.22–5.81)
RBC: 4.38 MIL/uL (ref 4.22–5.81)
RBC: 4.6 MIL/uL (ref 4.22–5.81)
RBC: 4.87 MIL/uL (ref 4.22–5.81)
RBC: 4.98 MIL/uL (ref 4.22–5.81)
RBC: 5.08 MIL/uL (ref 4.22–5.81)
RDW: 17.7 % — ABNORMAL HIGH (ref 11.5–15.5)
RDW: 18 % — ABNORMAL HIGH (ref 11.5–15.5)
RDW: 18 % — ABNORMAL HIGH (ref 11.5–15.5)
RDW: 16.2 % — ABNORMAL HIGH (ref 11.5–15.5)
RDW: 16.4 % — ABNORMAL HIGH (ref 11.5–15.5)
RDW: 16.5 % — ABNORMAL HIGH (ref 11.5–15.5)
RDW: 16.9 % — ABNORMAL HIGH (ref 11.5–15.5)
RDW: 17.2 % — ABNORMAL HIGH (ref 11.5–15.5)
RDW: 17.5 % — ABNORMAL HIGH (ref 11.5–15.5)
RDW: 17.6 % — ABNORMAL HIGH (ref 11.5–15.5)
RDW: 17.7 % — ABNORMAL HIGH (ref 11.5–15.5)
WBC: 6.6 10*3/uL (ref 4.0–10.5)
WBC: 6.6 10*3/uL (ref 4.0–10.5)
WBC: 10.4 10*3/uL (ref 4.0–10.5)
WBC: 6.6 10*3/uL (ref 4.0–10.5)
WBC: 7.1 10*3/uL (ref 4.0–10.5)
WBC: 9.9 10*3/uL (ref 4.0–10.5)
WBC: 9.9 10*3/uL (ref 4.0–10.5)

## 2010-08-17 LAB — MAGNESIUM
Magnesium: 1.9 mg/dL (ref 1.5–2.5)
Magnesium: 2.2 mg/dL (ref 1.5–2.5)
Magnesium: 3.2 mg/dL — ABNORMAL HIGH (ref 1.5–2.5)

## 2010-08-17 LAB — GLUCOSE, CAPILLARY
Glucose-Capillary: 103 mg/dL — ABNORMAL HIGH (ref 70–99)
Glucose-Capillary: 112 mg/dL — ABNORMAL HIGH (ref 70–99)
Glucose-Capillary: 115 mg/dL — ABNORMAL HIGH (ref 70–99)
Glucose-Capillary: 116 mg/dL — ABNORMAL HIGH (ref 70–99)
Glucose-Capillary: 116 mg/dL — ABNORMAL HIGH (ref 70–99)
Glucose-Capillary: 124 mg/dL — ABNORMAL HIGH (ref 70–99)
Glucose-Capillary: 93 mg/dL (ref 70–99)
Glucose-Capillary: 95 mg/dL (ref 70–99)

## 2010-08-17 LAB — BLOOD GAS, ARTERIAL
Acid-Base Excess: 8.9 mmol/L — ABNORMAL HIGH (ref 0.0–2.0)
Drawn by: 23588
FIO2: 0.45 %
MECHVT: 400 mL
O2 Saturation: 94.9 %
O2 Saturation: 95.6 %
Patient temperature: 99.3
RATE: 12 resp/min
TCO2: 36.2 mmol/L (ref 0–100)
TCO2: 38.8 mmol/L (ref 0–100)
pCO2 arterial: 60.7 mmHg (ref 35.0–45.0)
pCO2 arterial: 63.4 mmHg (ref 35.0–45.0)
pH, Arterial: 7.345 — ABNORMAL LOW (ref 7.350–7.450)
pH, Arterial: 7.403 (ref 7.350–7.450)
pO2, Arterial: 74.7 mmHg — ABNORMAL LOW (ref 80.0–100.0)
pO2, Arterial: 77.4 mmHg — ABNORMAL LOW (ref 80.0–100.0)

## 2010-08-17 LAB — COMPREHENSIVE METABOLIC PANEL
AST: 45 U/L — ABNORMAL HIGH (ref 0–37)
Albumin: 2.3 g/dL — ABNORMAL LOW (ref 3.5–5.2)
CO2: 37 mEq/L — ABNORMAL HIGH (ref 19–32)
Calcium: 9.1 mg/dL (ref 8.4–10.5)
Creatinine, Ser: 1.1 mg/dL (ref 0.4–1.5)
GFR calc Af Amer: >60 mL/min (ref >60)
GFR calc non Af Amer: >60 mL/min (ref >60)
Sodium: 139 mEq/L (ref 135–145)
Total Protein: 7.9 g/dL (ref 6.0–8.3)

## 2010-08-17 LAB — PROTIME-INR
INR: 1.4 (ref 0.00–1.49)
INR: 2 — ABNORMAL HIGH (ref 0.00–1.49)
Prothrombin Time: 21 seconds — ABNORMAL HIGH (ref 11.6–15.2)

## 2010-08-17 LAB — HEPARIN LEVEL (UNFRACTIONATED): Heparin Unfractionated: 0.31 IU/mL (ref 0.30–0.70)

## 2010-08-17 LAB — RETICULOCYTES
RBC.: 4.21 MIL/uL — ABNORMAL LOW (ref 4.22–5.81)
Retic Ct Pct: 0.7 % (ref 0.4–3.1)

## 2010-08-17 LAB — FOLATE: Folate: 10.8 ng/mL

## 2010-08-17 LAB — IRON AND TIBC: UIBC: 163 ug/dL

## 2010-08-17 LAB — FERRITIN: Ferritin: 590 ng/mL — ABNORMAL HIGH (ref 22–322)

## 2010-08-18 LAB — COMPREHENSIVE METABOLIC PANEL
ALT: 41 U/L (ref 0–53)
CO2: 41 mEq/L — ABNORMAL HIGH (ref 19–32)
Calcium: 8.7 mg/dL (ref 8.4–10.5)
GFR calc non Af Amer: >60 mL/min (ref >60)
Glucose, Bld: 89 mg/dL (ref 70–99)
Sodium: 142 mEq/L (ref 135–145)
Total Bilirubin: 0.8 mg/dL (ref 0.3–1.2)

## 2010-08-18 LAB — BLOOD GAS, ARTERIAL
Acid-Base Excess: 10 mmol/L — ABNORMAL HIGH (ref 0.0–2.0)
Acid-Base Excess: 12.6 mmol/L — ABNORMAL HIGH (ref 0.0–2.0)
Acid-Base Excess: 12.6 mmol/L — ABNORMAL HIGH (ref 0.0–2.0)
Acid-Base Excess: 13.6 mmol/L — ABNORMAL HIGH (ref 0.0–2.0)
Acid-Base Excess: 14.1 mmol/L — ABNORMAL HIGH (ref 0.0–2.0)
Acid-Base Excess: 7.8 mmol/L — ABNORMAL HIGH (ref 0.0–2.0)
Acid-Base Excess: 9.4 mmol/L — ABNORMAL HIGH (ref 0.0–2.0)
Acid-Base Excess: 9.4 mmol/L — ABNORMAL HIGH (ref 0.0–2.0)
Acid-Base Excess: 9.5 mmol/L — ABNORMAL HIGH (ref 0.0–2.0)
Bicarbonate: 32.4 mEq/L — ABNORMAL HIGH (ref 20.0–24.0)
Bicarbonate: 32.7 mEq/L — ABNORMAL HIGH (ref 20.0–24.0)
Bicarbonate: 33.6 mEq/L — ABNORMAL HIGH (ref 20.0–24.0)
Bicarbonate: 33.9 mEq/L — ABNORMAL HIGH (ref 20.0–24.0)
Bicarbonate: 34.4 mEq/L — ABNORMAL HIGH (ref 20.0–24.0)
Bicarbonate: 34.4 mEq/L — ABNORMAL HIGH (ref 20.0–24.0)
Bicarbonate: 34.8 mEq/L — ABNORMAL HIGH (ref 20.0–24.0)
Bicarbonate: 35.7 mEq/L — ABNORMAL HIGH (ref 20.0–24.0)
Bicarbonate: 37.1 mEq/L — ABNORMAL HIGH (ref 20.0–24.0)
Bicarbonate: 39.2 mEq/L — ABNORMAL HIGH (ref 20.0–24.0)
Bicarbonate: 39.7 mEq/L — ABNORMAL HIGH (ref 20.0–24.0)
Drawn by: 283371
Drawn by: 283401
Drawn by: 30996
FIO2: 0.5 %
FIO2: 0.7 %
FIO2: 0.7 %
FIO2: 0.7 %
FIO2: 0.7 %
FIO2: 0.8 %
FIO2: 0.8 %
FIO2: 1 %
MECHVT: 440 mL
MECHVT: 440 mL
MECHVT: 500 mL
MECHVT: 500 mL
MECHVT: 510 mL
MECHVT: 510 mL
O2 Saturation: 88.1 %
O2 Saturation: 92 %
O2 Saturation: 93.3 %
O2 Saturation: 93.9 %
O2 Saturation: 94.2 %
O2 Saturation: 94.7 %
O2 Saturation: 94.7 %
O2 Saturation: 95.1 %
O2 Saturation: 95.3 %
O2 Saturation: 95.6 %
PEEP: 10 cmH2O
PEEP: 12 cmH2O
PEEP: 12 cmH2O
PEEP: 14 cmH2O
Patient temperature: 98.6
Patient temperature: 98.6
Patient temperature: 98.6
Patient temperature: 98.6
Patient temperature: 98.6
RATE: 16 resp/min
RATE: 22 resp/min
RATE: 22 resp/min
RATE: 22 resp/min
RATE: 22 resp/min
RATE: 24 resp/min
TCO2: 33.9 mmol/L (ref 0–100)
TCO2: 34.2 mmol/L (ref 0–100)
TCO2: 35.4 mmol/L (ref 0–100)
TCO2: 36.5 mmol/L (ref 0–100)
TCO2: 37.7 mmol/L (ref 0–100)
TCO2: 38.7 mmol/L (ref 0–100)
TCO2: 39.5 mmol/L (ref 0–100)
TCO2: 41.7 mmol/L (ref 0–100)
pCO2 arterial: 40 mmHg (ref 35.0–45.0)
pCO2 arterial: 44.6 mmHg (ref 35.0–45.0)
pCO2 arterial: 47.8 mmHg — ABNORMAL HIGH (ref 35.0–45.0)
pCO2 arterial: 48 mmHg — ABNORMAL HIGH (ref 35.0–45.0)
pCO2 arterial: 49.7 mmHg — ABNORMAL HIGH (ref 35.0–45.0)
pCO2 arterial: 52.4 mmHg — ABNORMAL HIGH (ref 35.0–45.0)
pCO2 arterial: 53.4 mmHg — ABNORMAL HIGH (ref 35.0–45.0)
pCO2 arterial: 57.2 mmHg (ref 35.0–45.0)
pCO2 arterial: 71.5 mmHg (ref 35.0–45.0)
pH, Arterial: 7.353 (ref 7.350–7.450)
pH, Arterial: 7.358 (ref 7.350–7.450)
pH, Arterial: 7.397 (ref 7.350–7.450)
pH, Arterial: 7.41 (ref 7.350–7.450)
pH, Arterial: 7.424 (ref 7.350–7.450)
pH, Arterial: 7.437 (ref 7.350–7.450)
pH, Arterial: 7.461 — ABNORMAL HIGH (ref 7.350–7.450)
pH, Arterial: 7.479 — ABNORMAL HIGH (ref 7.350–7.450)
pH, Arterial: 7.483 — ABNORMAL HIGH (ref 7.350–7.450)
pH, Arterial: 7.529 — ABNORMAL HIGH (ref 7.350–7.450)
pO2, Arterial: 136 mmHg — ABNORMAL HIGH (ref 80.0–100.0)
pO2, Arterial: 63.1 mmHg — ABNORMAL LOW (ref 80.0–100.0)
pO2, Arterial: 70.5 mmHg — ABNORMAL LOW (ref 80.0–100.0)
pO2, Arterial: 72.5 mmHg — ABNORMAL LOW (ref 80.0–100.0)
pO2, Arterial: 76.1 mmHg — ABNORMAL LOW (ref 80.0–100.0)
pO2, Arterial: 77.8 mmHg — ABNORMAL LOW (ref 80.0–100.0)
pO2, Arterial: 80.3 mmHg (ref 80.0–100.0)
pO2, Arterial: 80.3 mmHg (ref 80.0–100.0)
pO2, Arterial: 81.4 mmHg (ref 80.0–100.0)
pO2, Arterial: 82.3 mmHg (ref 80.0–100.0)

## 2010-08-18 LAB — GLUCOSE, CAPILLARY
Glucose-Capillary: 103 mg/dL — ABNORMAL HIGH (ref 70–99)
Glucose-Capillary: 105 mg/dL — ABNORMAL HIGH (ref 70–99)
Glucose-Capillary: 106 mg/dL — ABNORMAL HIGH (ref 70–99)
Glucose-Capillary: 108 mg/dL — ABNORMAL HIGH (ref 70–99)
Glucose-Capillary: 110 mg/dL — ABNORMAL HIGH (ref 70–99)
Glucose-Capillary: 112 mg/dL — ABNORMAL HIGH (ref 70–99)
Glucose-Capillary: 115 mg/dL — ABNORMAL HIGH (ref 70–99)
Glucose-Capillary: 118 mg/dL — ABNORMAL HIGH (ref 70–99)
Glucose-Capillary: 119 mg/dL — ABNORMAL HIGH (ref 70–99)
Glucose-Capillary: 121 mg/dL — ABNORMAL HIGH (ref 70–99)
Glucose-Capillary: 121 mg/dL — ABNORMAL HIGH (ref 70–99)
Glucose-Capillary: 123 mg/dL — ABNORMAL HIGH (ref 70–99)
Glucose-Capillary: 123 mg/dL — ABNORMAL HIGH (ref 70–99)
Glucose-Capillary: 124 mg/dL — ABNORMAL HIGH (ref 70–99)
Glucose-Capillary: 127 mg/dL — ABNORMAL HIGH (ref 70–99)
Glucose-Capillary: 127 mg/dL — ABNORMAL HIGH (ref 70–99)
Glucose-Capillary: 129 mg/dL — ABNORMAL HIGH (ref 70–99)
Glucose-Capillary: 134 mg/dL — ABNORMAL HIGH (ref 70–99)
Glucose-Capillary: 91 mg/dL (ref 70–99)
Glucose-Capillary: 94 mg/dL (ref 70–99)
Glucose-Capillary: 96 mg/dL (ref 70–99)
Glucose-Capillary: 98 mg/dL (ref 70–99)
Glucose-Capillary: 99 mg/dL (ref 70–99)

## 2010-08-18 LAB — CBC
HCT: 50.8 % (ref 39.0–52.0)
HCT: 51.7 % (ref 39.0–52.0)
HCT: 51.8 % (ref 39.0–52.0)
HCT: 52.6 % — ABNORMAL HIGH (ref 39.0–52.0)
HCT: 52.9 % — ABNORMAL HIGH (ref 39.0–52.0)
HCT: 53.2 % — ABNORMAL HIGH (ref 39.0–52.0)
HCT: 54.4 % — ABNORMAL HIGH (ref 39.0–52.0)
HCT: 54.6 % — ABNORMAL HIGH (ref 39.0–52.0)
HCT: 55.7 % — ABNORMAL HIGH (ref 39.0–52.0)
Hemoglobin: 17 g/dL (ref 13.0–17.0)
Hemoglobin: 17.1 g/dL — ABNORMAL HIGH (ref 13.0–17.0)
Hemoglobin: 17.1 g/dL — ABNORMAL HIGH (ref 13.0–17.0)
Hemoglobin: 17.1 g/dL — ABNORMAL HIGH (ref 13.0–17.0)
Hemoglobin: 17.2 g/dL — ABNORMAL HIGH (ref 13.0–17.0)
Hemoglobin: 17.3 g/dL — ABNORMAL HIGH (ref 13.0–17.0)
Hemoglobin: 17.5 g/dL — ABNORMAL HIGH (ref 13.0–17.0)
Hemoglobin: 17.8 g/dL — ABNORMAL HIGH (ref 13.0–17.0)
MCHC: 31.8 g/dL (ref 30.0–36.0)
MCHC: 32.3 g/dL (ref 30.0–36.0)
MCHC: 32.4 g/dL (ref 30.0–36.0)
MCHC: 32.6 g/dL (ref 30.0–36.0)
MCV: 89.9 fL (ref 78.0–100.0)
MCV: 91 fL (ref 78.0–100.0)
MCV: 91.5 fL (ref 78.0–100.0)
MCV: 91.6 fL (ref 78.0–100.0)
MCV: 91.8 fL (ref 78.0–100.0)
MCV: 92.1 fL (ref 78.0–100.0)
Platelets: 176 10*3/uL (ref 150–400)
Platelets: 183 10*3/uL (ref 150–400)
Platelets: 203 10*3/uL (ref 150–400)
Platelets: 204 10*3/uL (ref 150–400)
Platelets: 210 10*3/uL (ref 150–400)
Platelets: 222 10*3/uL (ref 150–400)
RBC: 5.67 MIL/uL (ref 4.22–5.81)
RBC: 5.71 MIL/uL (ref 4.22–5.81)
RBC: 5.76 MIL/uL (ref 4.22–5.81)
RBC: 5.82 MIL/uL — ABNORMAL HIGH (ref 4.22–5.81)
RBC: 5.85 MIL/uL — ABNORMAL HIGH (ref 4.22–5.81)
RBC: 5.88 MIL/uL — ABNORMAL HIGH (ref 4.22–5.81)
RBC: 5.96 MIL/uL — ABNORMAL HIGH (ref 4.22–5.81)
RBC: 5.99 MIL/uL — ABNORMAL HIGH (ref 4.22–5.81)
RDW: 16.3 % — ABNORMAL HIGH (ref 11.5–15.5)
RDW: 16.6 % — ABNORMAL HIGH (ref 11.5–15.5)
RDW: 17.1 % — ABNORMAL HIGH (ref 11.5–15.5)
WBC: 10.2 10*3/uL (ref 4.0–10.5)
WBC: 10.5 10*3/uL (ref 4.0–10.5)
WBC: 11 10*3/uL — ABNORMAL HIGH (ref 4.0–10.5)
WBC: 11.4 10*3/uL — ABNORMAL HIGH (ref 4.0–10.5)
WBC: 11.5 10*3/uL — ABNORMAL HIGH (ref 4.0–10.5)
WBC: 8.1 10*3/uL (ref 4.0–10.5)
WBC: 8.6 10*3/uL (ref 4.0–10.5)

## 2010-08-18 LAB — CULTURE, BAL-QUANTITATIVE W GRAM STAIN: Colony Count: >=100000

## 2010-08-18 LAB — BASIC METABOLIC PANEL
BUN: 10 mg/dL (ref 6–23)
BUN: 10 mg/dL (ref 6–23)
BUN: 15 mg/dL (ref 6–23)
BUN: 20 mg/dL (ref 6–23)
BUN: 30 mg/dL — ABNORMAL HIGH (ref 6–23)
BUN: 7 mg/dL (ref 6–23)
BUN: 9 mg/dL (ref 6–23)
CO2: 33 mEq/L — ABNORMAL HIGH (ref 19–32)
CO2: 33 mEq/L — ABNORMAL HIGH (ref 19–32)
CO2: 33 mEq/L — ABNORMAL HIGH (ref 19–32)
CO2: 35 mEq/L — ABNORMAL HIGH (ref 19–32)
CO2: 36 mEq/L — ABNORMAL HIGH (ref 19–32)
CO2: 36 mEq/L — ABNORMAL HIGH (ref 19–32)
CO2: >45 mEq/L — ABNORMAL HIGH (ref 19–32)
Calcium: 10 mg/dL (ref 8.4–10.5)
Calcium: 8.4 mg/dL (ref 8.4–10.5)
Calcium: 8.5 mg/dL (ref 8.4–10.5)
Calcium: 8.7 mg/dL (ref 8.4–10.5)
Calcium: 8.7 mg/dL (ref 8.4–10.5)
Calcium: 8.8 mg/dL (ref 8.4–10.5)
Calcium: 8.8 mg/dL (ref 8.4–10.5)
Calcium: 9.1 mg/dL (ref 8.4–10.5)
Calcium: 9.5 mg/dL (ref 8.4–10.5)
Calcium: 9.5 mg/dL (ref 8.4–10.5)
Chloride: 105 mEq/L (ref 96–112)
Chloride: 105 mEq/L (ref 96–112)
Chloride: 108 mEq/L (ref 96–112)
Chloride: 85 mEq/L — ABNORMAL LOW (ref 96–112)
Chloride: 90 mEq/L — ABNORMAL LOW (ref 96–112)
Chloride: 92 mEq/L — ABNORMAL LOW (ref 96–112)
Creatinine, Ser: 0.91 mg/dL (ref 0.4–1.5)
Creatinine, Ser: 0.98 mg/dL (ref 0.4–1.5)
Creatinine, Ser: 1.01 mg/dL (ref 0.4–1.5)
Creatinine, Ser: 1.04 mg/dL (ref 0.4–1.5)
Creatinine, Ser: 1.06 mg/dL (ref 0.4–1.5)
Creatinine, Ser: 1.42 mg/dL (ref 0.4–1.5)
Creatinine, Ser: 1.58 mg/dL — ABNORMAL HIGH (ref 0.4–1.5)
Creatinine, Ser: 1.99 mg/dL — ABNORMAL HIGH (ref 0.4–1.5)
GFR calc Af Amer: 57 mL/min — ABNORMAL LOW (ref >60)
GFR calc Af Amer: >60 mL/min (ref >60)
GFR calc Af Amer: >60 mL/min (ref >60)
GFR calc Af Amer: >60 mL/min (ref >60)
GFR calc Af Amer: >60 mL/min (ref >60)
GFR calc Af Amer: >60 mL/min (ref >60)
GFR calc Af Amer: >60 mL/min (ref >60)
GFR calc Af Amer: >60 mL/min (ref >60)
GFR calc Af Amer: >60 mL/min (ref >60)
GFR calc Af Amer: >60 mL/min (ref >60)
GFR calc non Af Amer: 47 mL/min — ABNORMAL LOW (ref >60)
GFR calc non Af Amer: 60 mL/min — ABNORMAL LOW (ref >60)
GFR calc non Af Amer: >60 mL/min (ref >60)
GFR calc non Af Amer: >60 mL/min (ref >60)
GFR calc non Af Amer: >60 mL/min (ref >60)
GFR calc non Af Amer: >60 mL/min (ref >60)
GFR calc non Af Amer: >60 mL/min (ref >60)
GFR calc non Af Amer: >60 mL/min (ref >60)
GFR calc non Af Amer: >60 mL/min (ref >60)
Glucose, Bld: 107 mg/dL — ABNORMAL HIGH (ref 70–99)
Glucose, Bld: 109 mg/dL — ABNORMAL HIGH (ref 70–99)
Glucose, Bld: 109 mg/dL — ABNORMAL HIGH (ref 70–99)
Glucose, Bld: 119 mg/dL — ABNORMAL HIGH (ref 70–99)
Glucose, Bld: 126 mg/dL — ABNORMAL HIGH (ref 70–99)
Glucose, Bld: 161 mg/dL — ABNORMAL HIGH (ref 70–99)
Glucose, Bld: 164 mg/dL — ABNORMAL HIGH (ref 70–99)
Glucose, Bld: 91 mg/dL (ref 70–99)
Potassium: 3.2 mEq/L — ABNORMAL LOW (ref 3.5–5.1)
Potassium: 3.3 mEq/L — ABNORMAL LOW (ref 3.5–5.1)
Potassium: 3.4 mEq/L — ABNORMAL LOW (ref 3.5–5.1)
Potassium: 3.5 mEq/L (ref 3.5–5.1)
Potassium: 3.8 mEq/L (ref 3.5–5.1)
Potassium: 4.1 mEq/L (ref 3.5–5.1)
Potassium: 4.3 mEq/L (ref 3.5–5.1)
Potassium: 4.3 mEq/L (ref 3.5–5.1)
Potassium: 4.5 mEq/L (ref 3.5–5.1)
Potassium: 4.6 mEq/L (ref 3.5–5.1)
Potassium: 4.8 mEq/L (ref 3.5–5.1)
Sodium: 134 mEq/L — ABNORMAL LOW (ref 135–145)
Sodium: 134 mEq/L — ABNORMAL LOW (ref 135–145)
Sodium: 134 mEq/L — ABNORMAL LOW (ref 135–145)
Sodium: 135 mEq/L (ref 135–145)
Sodium: 137 mEq/L (ref 135–145)
Sodium: 137 mEq/L (ref 135–145)
Sodium: 138 mEq/L (ref 135–145)
Sodium: 140 mEq/L (ref 135–145)
Sodium: 151 mEq/L — ABNORMAL HIGH (ref 135–145)
Sodium: 153 mEq/L — ABNORMAL HIGH (ref 135–145)

## 2010-08-18 LAB — DIFFERENTIAL
Basophils Absolute: 0.1 10*3/uL (ref 0.0–0.1)
Basophils Relative: 0 % (ref 0–1)
Basophils Relative: 0 % (ref 0–1)
Basophils Relative: 0 % (ref 0–1)
Basophils Relative: 1 % (ref 0–1)
Eosinophils Absolute: 0.1 10*3/uL (ref 0.0–0.7)
Eosinophils Absolute: 0.2 10*3/uL (ref 0.0–0.7)
Eosinophils Absolute: 0.4 10*3/uL (ref 0.0–0.7)
Eosinophils Absolute: 0.4 10*3/uL (ref 0.0–0.7)
Eosinophils Relative: 1 % (ref 0–5)
Eosinophils Relative: 3 % (ref 0–5)
Eosinophils Relative: 4 % (ref 0–5)
Lymphocytes Relative: 11 % — ABNORMAL LOW (ref 12–46)
Lymphs Abs: 0.7 10*3/uL (ref 0.7–4.0)
Lymphs Abs: 1.1 10*3/uL (ref 0.7–4.0)
Lymphs Abs: 1.4 10*3/uL (ref 0.7–4.0)
Monocytes Absolute: 1.5 10*3/uL — ABNORMAL HIGH (ref 0.1–1.0)
Monocytes Relative: 12 % (ref 3–12)
Monocytes Relative: 15 % — ABNORMAL HIGH (ref 3–12)
Neutrophils Relative %: 70 % (ref 43–77)
Neutrophils Relative %: 75 % (ref 43–77)
Neutrophils Relative %: 77 % (ref 43–77)

## 2010-08-18 LAB — CARDIAC PANEL(CRET KIN+CKTOT+MB+TROPI)
CK, MB: 4.3 ng/mL — ABNORMAL HIGH (ref 0.3–4.0)
CK, MB: 4.6 ng/mL — ABNORMAL HIGH (ref 0.3–4.0)
Relative Index: 1.3 (ref 0.0–2.5)
Relative Index: 1.3 (ref 0.0–2.5)
Relative Index: 2.3 (ref 0.0–2.5)
Total CK: 197 U/L (ref 7–232)
Total CK: 458 U/L — ABNORMAL HIGH (ref 7–232)
Troponin I: 0.03 ng/mL (ref 0.00–0.06)
Troponin I: 0.17 ng/mL — ABNORMAL HIGH (ref 0.00–0.06)

## 2010-08-18 LAB — POCT I-STAT EG7
Bicarbonate: 35.6 mEq/L — ABNORMAL HIGH (ref 20.0–24.0)
Hemoglobin: 22.8 g/dL — ABNORMAL HIGH (ref 13.0–17.0)
O2 Saturation: 87 %
TCO2: 38 mmol/L (ref 0–100)
pCO2, Ven: 81.4 mmHg (ref 45.0–50.0)
pH, Ven: 7.249 — ABNORMAL LOW (ref 7.250–7.300)
pO2, Ven: 65 mmHg — ABNORMAL HIGH (ref 30.0–45.0)

## 2010-08-18 LAB — HEMOGLOBIN A1C: Hgb A1c MFr Bld: 6.3 % — ABNORMAL HIGH (ref 4.6–6.1)

## 2010-08-18 LAB — POCT I-STAT 3, ART BLOOD GAS (G3+)
O2 Saturation: 95 %
pCO2 arterial: 100.7 mmHg (ref 35.0–45.0)
pH, Arterial: 7.165 — CL (ref 7.350–7.450)
pO2, Arterial: 100 mmHg (ref 80.0–100.0)

## 2010-08-18 LAB — CULTURE, BLOOD (ROUTINE X 2)
Culture: NO GROWTH
Culture: NO GROWTH

## 2010-08-18 LAB — URINE CULTURE: Culture: NO GROWTH

## 2010-08-18 LAB — MAGNESIUM
Magnesium: 2.1 mg/dL (ref 1.5–2.5)
Magnesium: 2.2 mg/dL (ref 1.5–2.5)
Magnesium: 2.3 mg/dL (ref 1.5–2.5)
Magnesium: 2.5 mg/dL (ref 1.5–2.5)
Magnesium: 2.5 mg/dL (ref 1.5–2.5)

## 2010-08-18 LAB — URINALYSIS, ROUTINE W REFLEX MICROSCOPIC
Bilirubin Urine: NEGATIVE
Bilirubin Urine: NEGATIVE
Glucose, UA: NEGATIVE mg/dL
Ketones, ur: NEGATIVE mg/dL
Leukocytes, UA: NEGATIVE
Nitrite: NEGATIVE
Nitrite: NEGATIVE
Specific Gravity, Urine: 1.01 (ref 1.005–1.030)
Specific Gravity, Urine: 1.014 (ref 1.005–1.030)
Urobilinogen, UA: 2 mg/dL — ABNORMAL HIGH (ref 0.0–1.0)
pH: 7 (ref 5.0–8.0)
pH: 8 (ref 5.0–8.0)

## 2010-08-18 LAB — CALCIUM, IONIZED: Calcium, Ion: 0.91 mmol/L — ABNORMAL LOW (ref 1.12–1.32)

## 2010-08-18 LAB — APTT
aPTT: 27 seconds (ref 24–37)
aPTT: 32 seconds (ref 24–37)

## 2010-08-18 LAB — CLOSTRIDIUM DIFFICILE EIA: C difficile Toxins A+B, EIA: NEGATIVE

## 2010-08-18 LAB — PROTIME-INR
INR: 1.1 (ref 0.00–1.49)
INR: 1.2 (ref 0.00–1.49)
INR: 1.2 (ref 0.00–1.49)
Prothrombin Time: 15.4 seconds — ABNORMAL HIGH (ref 11.6–15.2)

## 2010-08-18 LAB — BRAIN NATRIURETIC PEPTIDE
Pro B Natriuretic peptide (BNP): 214 pg/mL — ABNORMAL HIGH (ref 0.0–100.0)
Pro B Natriuretic peptide (BNP): 66 pg/mL (ref 0.0–100.0)

## 2010-08-18 LAB — URINE MICROSCOPIC-ADD ON

## 2010-08-18 LAB — D-DIMER, QUANTITATIVE: D-Dimer, Quant: 1.02 ug/mL-FEU — ABNORMAL HIGH (ref 0.00–0.48)

## 2010-08-18 LAB — PHOSPHORUS: Phosphorus: 4.4 mg/dL (ref 2.3–4.6)

## 2010-09-21 NOTE — Discharge Summary (Signed)
NAME:  ORVA, GWALTNEY NO.:  [id number]   MEDICAL RECORD NO.:  [id number]          PATIENT TYPE:  INP   LOCATION:  2602                         FACILITY:  [facility]   PHYSICIAN:  Elmore Guise., M.D.DATE OF BIRTH:  1980-11-13   DATE OF ADMISSION:  08/22/2008  DATE OF DISCHARGE:  10/18/2008                               DISCHARGE SUMMARY   DISCHARGE DIAGNOSES:  1. Obesity hypoventilation syndrome.  2. Morbid obesity.  3. Diastolic dysfunction.  4. Gastroesophageal reflux disease.  5. Right upper extremity deep venous thrombosis, likely secondary to      PICC line placement.  6. History of respiratory failure, status post tracheostomy placement.  7. Obstructive sleep apnea syndrome.   HISTORY OF PRESENT ILLNESS:  Tony Mccullough is a 30 year old African American  male with past medical history of morbid obesity, who presented for  evaluation of increasing dyspnea and hypoxia.  The patient was admitted  for evaluation and treatment.   HOSPITAL COURSE:  The patient's hospital course was prolonged.  He was  initially admitted for workup of his shortness of breath and hypoxia.  He was diuresed and underwent a V/Q scan to rule out pulmonary embolism.  Because of RV dilatation noted on his surface echo, the patient was  referred for a transesophageal echo to make sure he had no shunting from  his right-to-left heart, which could cause some of his hypoxic problems.  During his procedure, he was given low doses of fentanyl and Versed, and  was following commands.  His TEE showed normal LV size and function with  an EF of 65%.  His RV was moderately dilated with normal function.  The  atrial septum bowed from right to left consistent with increased right  atrial pressure, but no right-to-left shunt was noted.  Ventricular  septum with D shaped consistent with chronic RV volume and pressure  overload.  Following his procedure, the patient became apneic.  His  sedation was  quickly reversed.  Unfortunately, the patient became more  and more unresponsive and required intubation.  He was then transferred  to the CCU for further care.  He was briefly coated in the endoscopy  lab, requiring atropine and epinephrine because of bradycardia  associated with his apnea.  Once intubated, his heart rate returned to  normal.  He did have a brief episode of rapid atrial fibrillation that  responded to IV amiodarone drip.  A Pulmonary consult was obtained.  Assistance with ventilator settings was appreciated.  The patient  remained vent dependent for the next 2 weeks.  Because of continued  hypoxemia felt secondary to acute lung injury, the patient underwent  tracheostomy placement.  Following his tracheostomy, he initially did  well.  Unfortunately his trach came out this required replacement of  his device.  The patient was weaned from the vent over the next 3 weeks.  He remained hemodynamically stable.  Because of significant obstructive  sleep apnea, the patient required ventilator treatment at night.  During  the day, he was up and ambulatory, requiring no further supplemental  oxygen.  He  had a weight loss of approximately 70 pounds during his  hospitalization with weight on discharge of 395 pounds.  His blood  pressure and heart rate remained stable.  He was participating with  cardiac rehab as well as going to the rehabilitation gym to use their  machine.  He had a prolonged stay of additional 4-6 weeks since he had  no insurance and none of the home health agencies were willing to give  him a nighttime ventilator for use.  After he was approved for Medicaid,  home health agreed for home ventilator therapy.  While in the Step-Down  Unit, the patient was noted to have swelling of his right upper  extremity where his PICC line was placed.  He was noted to have a  thrombus extending from the distal portion of his PICC line in his right  subclavian, right axillary, and  right brachial vein.  His PICC line was  removed.  The patient was started on treatment dose Lovenox, which was  continued until his INR was therapeutic between the 2 and 3 range.  The  patient has shown tremendous progress throughout his hospital stay.  He  was discharged home on October 18, 2008.   DISCHARGE MEDICATIONS:  1. Albuterol nebs every 4-6 hours as needed for wheezing.  2. Protonix 40 mg daily.  3. Coreg 6.25 mg twice daily.  4. Lasix 40 mg 2 times daily.  5. Coumadin 7.5 mg daily.   FOLLOWUP:  His follow up was with Dr. Reyes Ivan at [facility] on  Friday, June 18 at 10:30.  He was going to have repeat PT/INR done at  that time.  His follow up with Pulmonary was with Dr. Craige Cotta on June 22 at  10:45.  All his home health supplies were obtained.  The appropriate  outpatient supplies were set up for him.   On discharge, his PT/INR were 30.3 and 2.7 with a hemoglobin of 12.7,  platelet count of 300, and white blood cell count of 6.7.  His renal  function was stable with a BUN and creatinine of 8 and 0.7, and a  potassium level of 3.8.  All his questions were answered prior to  discharge.  He was discharged to home.  His mother underwent appropriate  instructions on his nighttime ventilator for troubleshooting if need be.  He is to call me at [facility] should he have any further  questions or concerns.  My number and pager number was given to him  directly.      Elmore Guise., M.D.  Electronically Signed     TWK/MEDQ  D:  11/15/2008  T:  11/16/2008  Job:  [id number]

## 2010-09-21 NOTE — H&P (Signed)
NAME:  Tony Mccullough, Tony Mccullough NO.:  [id number]   MEDICAL RECORD NO.:  [id number]          PATIENT TYPE:  INP   LOCATION:  3729                         FACILITY:  [facility]   PHYSICIAN:  Elmore Guise., M.D.DATE OF BIRTH:  10-07-1980   DATE OF ADMISSION:  08/22/2008  DATE OF DISCHARGE:                              HISTORY & PHYSICAL   INDICATIONS FOR ADMISSION:  Hypoxia and chest pain.   HISTORY OF PRESENT ILLNESS:  Tony Mccullough is a 30 year old African American  male with a past medical history of morbid obesity, likely sleep apnea,  who presents with a two-week history of progressive dyspnea on exertion,  shortness of breath at rest, increasing lower extremity edema and lower  sternal and upper abdominal chest pressure.  The patient reports I have  been heavy-set up for some time.  He reports he was last seen in the  hospital back in 2007, and checked out okay.  He used to be more  active at work and two years ago could walk a couple of blocks without  any problems; however, now he gets dyspneic walking more than 10 feet at  a time.  He has significant orthopnea and he has been sleeping upright  with his head leaned over like laying on a desk at school.  He denies  any fever or cough.  No palpitations.  He was seen in urgent care  yesterday because of his symptoms, and is now referred for further  evaluation.  Today in the office his oxygen saturation is 78% at rest.  This decreases is down to 72% with activity.  The patient denies any leg  trauma or prolonged inactivity such as plane trips or car trips.  He  said he has been checked for blood clots in the past and they have  been okay.  He does snore heavily.  His retrosternal chest pain feels  like a pressure that starts in the upper abdomen area.  He will try to  shift his weight around, with some improvement.  He states sometimes  I do not eat like I should.  He does not take any medications.   REVIEW OF SYSTEMS:   Review of systems are as per the HPI.  All others  are negative.   CURRENT MEDICATIONS:  None.   ALLERGIES:  None.   FAMILY HISTORY:  Family history is positive for heart failure,  hypertension with his father, diabetes with his mother.   SOCIAL HISTORY:  The patient is single.  He works as a Financial risk analyst at  [employer].  No tobacco or alcohol, at least one soda per day.   PHYSICAL EXAMINATION:  VITAL SIGNS:  Weight is 470 pounds.  His blood  pressure is 138/82.  His heart rate is 84 and regular.  GENERAL:  In general he is a very pleasant young-appearing African  American male, alert and oriented x4, in no acute distress.  His O2 sat  at rest is 78%.  When I walk him around the office he does get  profoundly dyspneic.  SKIN:  Shows acanthosis nigricans on  his neck.  He has also some skin  tags on the posterior aspect of his cervical region there.  HEENT:  Is unremarkable.  NECK:  Is supple, no lymphadenopathy, no JVD, no bruits.  LUNGS:  Clear with good breath sounds to the bases.  HEART:  Heart is regular and tachycardiac on exam.  No rub noted.  ABDOMEN:  Obese, soft, nontender, nondistended.  EXTREMITIES:  Have at least 2+ woody pretibial edema, consistent with  chronic venous stasis.  There is no redness, erythema or warmth.  NEUROLOGIC:  Neurologically the patient has good strength in his upper  and lower extremities.  No focal deficits are noted.   His ECG from [facility] showed normal sinus rhythm, rate of 88 per  minute, with right axis deviation, no significant ST or T-wave changes.   He also had a chest x-ray done which showed marked cardiomegaly.   IMPRESSION:  1. Hypoxemia.  2. Chest pain.  3. Morbid obesity.  4. Likely sleep apnea.   PLAN:  I have recommended hospital admission.  With his hypoxemia, I am  concerned about pulmonary embolism, as well as a right-to-left shunt.  He will have a D-dimer performed.  If this is abnormal, we will then  send him for  a CT scan or possible VQ study.  We will also try to get an  echocardiogram; however, with his large size, I would doubt this will  yield significant results.  He likely will need a TEE.  I will  empirically anticoagulate him.  He will also be started on Lasix 40 mg  IV q.12 h.  We will check a BNP level, CBC, CMP, hemoglobin A1c, as well  as serial cardiac markers.  He will be given supplemental oxygen for  now.  I discussed with him long-term that he does need to lose of 250  pounds and would need to get into some type of program; however, we will  worry more about this once his hypoxemia is reversed.  All his questions  were answered prior to admission.      Elmore Guise., M.D.  Electronically Signed     TWK/MEDQ  D:  08/22/2008  T:  08/22/2008  Job:  [id number]

## 2010-09-21 NOTE — Op Note (Signed)
NAME:  Tony Mccullough, Tony Mccullough NO.:  [id number]   MEDICAL RECORD NO.:  [id number]           PATIENT TYPE:   LOCATION:                                 FACILITY:   PHYSICIAN:  Newman Pies, MD            DATE OF BIRTH:  May 07, 1981   DATE OF PROCEDURE:  09/04/2008  DATE OF DISCHARGE:                               OPERATIVE REPORT   PREOPERATIVE DIAGNOSES:  1. Acute respiratory distress syndrome.  2. Ventilator dependence.  3. Morbid obesity.  4. Obstructive sleep apnea.   POSTOPERATIVE DIAGNOSES:  1. Acute respiratory distress syndrome.  2. Ventilator dependence.  3. Morbid obesity.  4. Obstructive sleep apnea.   PROCEDURE PERFORMED:  Tracheostomy tube placement.   ANESTHESIA:  General endotracheal tube anesthesia.   COMPLICATIONS:  None.   ESTIMATED BLOOD LOSS:  Minimal.   INDICATIONS FOR PROCEDURE:  The patient is a 30 year old morbidly obese  male who was admitted to the [facility] for respiratory  distress.  The patient subsequently developed acute respiratory distress  syndrome, requiring intubation and ventilator support.  In light of his  morbid obesity and persistent ventilator dependence, ENT was consulted  for tracheostomy tube placement.  The risks, benefits, alternatives, and  details of the procedure were discussed with the patient and his mother.  Questions were invited and answered.  Informed consent was obtained.   DESCRIPTION:  The patient was taken to the operating room and placed  supine on the operating table.  General anesthesia was administered via  the preexisting endotracheal tube.  The patient was positioned and  prepped and draped in a standard fashion for tracheostomy tube  placement.  Lidocaine 1% with 1:100,000 epinephrine were injected at the  planned site of incisions.  A 2-cm vertical incision was made at the  level of the cricoid cartilage.  The incision was carried down to the  level of the strap muscles.  The strap muscles  were divided at midline  and retracted laterally.  The thyroid was also divided at midline and  retracted laterally, exposing the cricoid and the trachea wall.  A  tracheal window was made.  The endotracheal tube was withdrawn.  A #8  cuffed Shiley tracheostomy tube was placed without difficulty.  Good CO2  return was noted.  The trache tube was secured in place with Prolene  sutures in circumferential neck tie.  The care of the patient was turned  over to the anesthesiologist.  The patient was awakened from anesthesia  without difficulty.  He was transferred back to the intensive care unit  in stable condition.   OPERATIVE FINDING:  A #8 cuffed Shiley tracheostomy tube was placed.   SPECIMENS REMOVED:  None.   FOLLOWUP CARE:  The patient will be weaned off his ventilator as soon as  possible.  In light of his severe obesity and obstructive sleep apnea,  the patient will likely need the trache tube long term.      Newman Pies, MD  Electronically Signed     ST/MEDQ  D:  09/04/2008  T:  09/04/2008  Job:  [id number]

## 2010-09-21 NOTE — Procedures (Signed)
DUPLEX DEEP VENOUS EXAM - UPPER EXTREMITY   INDICATION:  Re-evaluate for thrombus.   HISTORY:  Edema:  No.  Trauma/Surgery:  A PICC line was placed in the right upper extremity in  May of 2010.  Pain:  No.  PE:  No.  Previous DVT:  Right brachial, axillary, subclavian and internal jugular  vein subacute thrombus noted on study done at [facility] vascular lab on  09/30/2008.  Anticoagulants:  Yes.  Other:   DUPLEX EXAM:                                             Bas/                IJV   SCV     AXV    BrachV  Ceph V                R  L  R   L   R  L   R   L   R  L  Thrombosis    o  o  o   o   o      o       o  Spontaneous   +  +  +   +   +      +       +  Phasic        +  +  +   +   +      +       +  Augmentation  +  +  +   +   +      +       +  Compressible  +  +  +   +   +      +       +  Competent  Legend:  + - yes  o - no  p - partial  D - decreased    IMPRESSION:  1. No evidence of right upper extremity deep vein thrombosis noted,      based on limited visualization due to patient body habitus.  2. Preliminary results were faxed to Dr. Silva Bandy office on      11/27/2008.   ___________________________________________  V. Charlena Cross, MD   CH/MEDQ  D:  11/27/2008  T:  11/27/2008  Job:  [id number]

## 2010-09-21 NOTE — Consult Note (Signed)
NAME:  Tony Mccullough, Tony Mccullough NO.:  [id number]   MEDICAL RECORD NO.:  [id number]          PATIENT TYPE:  INP   LOCATION:  2914                         FACILITY:  [facility]   PHYSICIAN:  Newman Pies, MD            DATE OF BIRTH:  1980/07/17   DATE OF CONSULTATION:  09/02/2008  DATE OF DISCHARGE:                                 CONSULTATION   CHIEF COMPLAINT:  Respiratory failure, ventilator dependent.   HISTORY OF PRESENT ILLNESS:  The patient is a 30 year old African  American male with a history of morbid obesity and obstructive sleep  apnea, who was admitted on August 22, 2008 to [facility] for  acute respiratory failure and pneumonia.  The patient subsequently  developed acute respiratory distress syndrome and heart failure.  The  patient has been ventilator dependent for the past week.  In light of  his morbid obesity and the severe sleep apnea, ENT was consulted for  possible tracheostomy tube placement.   REVIEW OF SYSTEMS:  History of progressive dyspnea on exertion and  shortness of breath at rest.   HOME MEDICATIONS:  None.   ALLERGIES:  No known drug allergies.   FAMILY HISTORY:  Positive for heart failure, hypertension, and diabetes.   SOCIAL HISTORY:  The patient is single.  He works as a Financial risk analyst at  [employer].  He denies the use of tobacco or alcohol.   PHYSICAL EXAMINATION:  GENERAL:  The patient is a morbidly obese 27-year-  old African American male in no acute distress.  He is noted to be  resting comfortably in his Intensive Care Unit bed.  He is currently  intubated.  However, he is awake and responsive.  He is able to  communicate with hand gesture and high handwriting notes.  HEENT:  His pupils are equal, round, and reactive to light.  Extraocular  motion is intact.  Examination of the ears shows normal auricles and  external auditory canals.  Nasal examination shows normal mucosa,  septum, and turbinates.  Oral cavity examination  shows an ET tube to be  in place.  No mass or lesion is noted.  Significant obesity is noted  around his neck.  The laryngeal framework could not be palpated.  However, no lymphadenopathy or mass is noted.   IMPRESSION:  1. Acute respiratory distress syndrome.  2. Morbid obesity.  3. Obstructive sleep apnea.  4. Congestive heart failure.   RECOMMENDATIONS:  In light of his ventilator dependence and acute  respiratory failure, the patient is a candidate to undergo a  tracheostomy placement.  The tracheostomy tube will likely be permanent,  unless he is able  to lose a significant amount of weight.  The patient currently has  significant obstructive sleep apnea.  The risks, benefits, and details  of the procedure were reviewed with the patient and his mother.  Questions were invited and answered.  Informed consent was obtained.      Newman Pies, MD  Electronically Signed     ST/MEDQ  D:  09/02/2008  T:  09/03/2008  Job:  [id number]

## 2011-04-29 ENCOUNTER — Other Ambulatory Visit: Payer: Self-pay | Admitting: *Deleted

## 2011-06-06 ENCOUNTER — Emergency Department (HOSPITAL_COMMUNITY)
Admission: EM | Admit: 2011-06-06 | Discharge: 2011-06-06 | Disposition: A | Discharge status: Home / Self Care | Payer: Self-pay | Attending: Emergency Medicine | Admitting: Emergency Medicine

## 2011-06-06 ENCOUNTER — Emergency Department (HOSPITAL_COMMUNITY): Payer: Self-pay

## 2011-06-06 ENCOUNTER — Encounter (HOSPITAL_COMMUNITY): Payer: Self-pay | Admitting: Emergency Medicine

## 2011-06-06 DIAGNOSIS — IMO0002 Reserved for concepts with insufficient information to code with codable children: Secondary | ICD-10-CM | POA: Insufficient documentation

## 2011-06-06 DIAGNOSIS — S43402A Unspecified sprain of left shoulder joint, initial encounter: Secondary | ICD-10-CM

## 2011-06-06 DIAGNOSIS — X500XXA Overexertion from strenuous movement or load, initial encounter: Secondary | ICD-10-CM | POA: Insufficient documentation

## 2011-06-06 DIAGNOSIS — I10 Essential (primary) hypertension: Secondary | ICD-10-CM | POA: Insufficient documentation

## 2011-06-06 DIAGNOSIS — M25519 Pain in unspecified shoulder: Secondary | ICD-10-CM | POA: Insufficient documentation

## 2011-06-06 HISTORY — DX: Essential (primary) hypertension: I10

## 2011-06-06 HISTORY — DX: Respiratory failure, unspecified, unspecified whether with hypoxia or hypercapnia: J96.90

## 2011-06-06 MED ORDER — OXYCODONE-ACETAMINOPHEN 5-325 MG PO TABS: 1.0000 | ORAL_TABLET | Freq: Once | ORAL | Status: DC

## 2011-06-06 MED ORDER — OXYCODONE-ACETAMINOPHEN 5-325 MG PO TABS: 1.0000 | ORAL_TABLET | ORAL | Status: AC | PRN

## 2011-06-06 NOTE — ED Notes (Signed)
Patient given discharge paperwork; went over discharge instructions with patient.  Instructed patient to follow up with orthopedist within one day, to take Percocet as directed, to not drive or operate heavy machinery with Percocet, and to return to the ED for new, worsening, or concerning symptoms.

## 2011-06-06 NOTE — ED Notes (Signed)
PT. REPORTS CHRONIC LEFT SHOULDER PAIN FOR SEVERAL WEEKS GOT WORSE  AFTER HEAVY LIFTING AT WORK TODAY , PAIN WORSE WITH CERTAIN POSITIONS AND MOVEMENT.

## 2011-06-06 NOTE — ED Notes (Signed)
Patient currently sitting up in bed; no respiratory or acute distress noted.  Patient updated on plan of care; informed patient that we are currently waiting on discharge paperwork from [facility].  Patient has no other questions or concerns at this time; will continue to monitor.

## 2011-06-06 NOTE — ED Notes (Signed)
Per pt:  Pt was at work this morning  lifting an object and dropped object and it "jerked" his shoulder.  Pt is able to lift his left arm shoulder length but states he has pain at that point.

## 2011-06-06 NOTE — ED Notes (Signed)
Patient refusing Percocet tablet; states that he is to have a urine drug screen done at his work.  MD notified.

## 2011-06-06 NOTE — ED Provider Notes (Signed)
History     CSN: [id number]  Arrival date & time 06/06/11  0417   First MD Initiated Contact with Patient 06/06/11 [phone]      Chief Complaint  Patient presents with  . Shoulder Pain     Patient is a 31 y.o. male presenting with shoulder pain. The history is provided by the patient.  Shoulder Pain This is a recurrent problem. The problem occurs constantly. The problem has been gradually worsening. Pertinent negatives include no chest pain and no shortness of breath. The symptoms are aggravated by twisting. The symptoms are relieved by nothing.  pt reports he has had left shoulder pain for weeks Tonight while at work he lifted something heavy and thinks he "pulled" his shoulder too much and he has increased pain No falls No other injury reported  Past Medical History  Diagnosis Date  . Hypertension   . Obesity   . Respiratory failure     Past Surgical History  Procedure Date  . Tracheostomy     No family history on file.  History  Substance Use Topics  . Smoking status: Never Smoker   . Smokeless tobacco: Not on file  . Alcohol Use: No      Review of Systems  Constitutional: Negative for fever.  Respiratory: Negative for shortness of breath.   Cardiovascular: Negative for chest pain.    Allergies  Review of patient's allergies indicates no known allergies.  Home Medications   Current Outpatient Rx  Name Route Sig Dispense Refill  . OXYCODONE-ACETAMINOPHEN 5-325 MG PO TABS Oral Take 1 tablet by mouth every 4 (four) hours as needed for pain. 15 tablet 0    BP 119/67  Pulse 115  Temp(Src) 98.6 F (37 C) (Oral)  Resp 18  SpO2 95%  Physical Exam CONSTITUTIONAL: Well developed/well nourished HEAD AND FACE: Normocephalic/atraumatic EYES: EOMI/PERRL ENMT: Mucous membranes moist NECK: supple no meningeal signsr CV: S1/S2 noted, no murmurs/rubs/gallops noted LUNGS: Lungs are clear to auscultation bilaterally, no apparent distress ABDOMEN: soft,  nontender, no rebound or guarding NEURO: Pt is awake/alert, moves all extremitiesx4 EXTREMITIES: pulses normal, full ROM, no deformity/bruising noted to left shoulder He has tenderness to palpation of left shoulder.  He is able to abduct/adduct left shoulder and able to lift left UE above head SKIN: warm, color normal PSYCH: no abnormalities of mood noted  ED Course  Procedures   Labs Reviewed - No data to display Dg Shoulder Left  06/06/2011  *RADIOLOGY REPORT*  Clinical Data: Left shoulder pain while lifting heavy object.  LEFT SHOULDER - 2+ VIEW  Comparison: None.  Findings: The left shoulder appears intact.  No evidence of acute fracture or subluxation.  No focal bone lesion or bone destruction. Bone cortex and trabecular architecture appear intact.  IMPRESSION: No acute bony abnormalities appreciated.  Original Report Authenticated By: Marlon Pel, M.D.     1. Sprain of left shoulder       MDM  Nursing notes reviewed and considered in documentation xrays reviewed and considered  Advised f/u with occupational health this week prior to returning to work  The patient appears reasonably screened and/or stabilized for discharge and I doubt any other medical condition or other [facility] requiring further screening, evaluation, or treatment in the ED at this time prior to discharge.         Joya Gaskins, MD 06/06/11 [phone]

## 2011-07-19 ENCOUNTER — Encounter: Payer: Self-pay | Admitting: *Deleted

## 2012-03-12 ENCOUNTER — Institutional Professional Consult (permissible substitution): Payer: Self-pay | Admitting: Medical

## 2012-03-23 ENCOUNTER — Ambulatory Visit (INDEPENDENT_AMBULATORY_CARE_PROVIDER_SITE_OTHER): Payer: Self-pay | Admitting: Medical

## 2012-03-23 ENCOUNTER — Encounter: Payer: Self-pay | Admitting: Medical

## 2012-03-23 VITALS — BP 118/82 | HR 88 | Temp 98.2°F | Wt 386.0 lb

## 2012-03-23 DIAGNOSIS — R7301 Impaired fasting glucose: Secondary | ICD-10-CM

## 2012-03-23 DIAGNOSIS — G4733 Obstructive sleep apnea (adult) (pediatric): Secondary | ICD-10-CM

## 2012-03-23 DIAGNOSIS — I10 Essential (primary) hypertension: Secondary | ICD-10-CM

## 2012-03-23 DIAGNOSIS — I890 Lymphedema, not elsewhere classified: Secondary | ICD-10-CM

## 2012-03-23 DIAGNOSIS — I8310 Varicose veins of unspecified lower extremity with inflammation: Secondary | ICD-10-CM

## 2012-03-23 DIAGNOSIS — I872 Venous insufficiency (chronic) (peripheral): Secondary | ICD-10-CM

## 2012-03-23 DIAGNOSIS — Z8679 Personal history of other diseases of the circulatory system: Secondary | ICD-10-CM

## 2012-03-23 NOTE — Progress Notes (Signed)
Subjective:   HPI  Tony Mccullough is a 31 y.o. male who presents as a new patient today.  Accompanied by his mother.  Was advised to get a PCM by Dr. Terri Piedra.  Saw Dr. Terri Piedra recently for skin breakdown of legs.  He reports chronic lower leg swelling, poor circulation and skin concerns.  Recently started having odorous non healing wounds on both lower legs x 41mo.  Was given ointment topically to use by derm and begun on Lasix 40mg  daily, advised to use leg wraps with ACE wraps to help with swelling.  Just started back on Lasix 40mg  a week ago.  Has been on lasix prior.  He works Engineer, technical sales.    He does have hx/o notable for prior hospitalization for respiratory failure.  Last routine medical care over 1.5 years ago.      No other c/o.  The following portions of the patient's history were reviewed and updated as appropriate: allergies, current medications, past family history, past medical history, past social history, past surgical history and problem list.  Past Medical History  Diagnosis Date  . Hypertension   . Respiratory failure 09/02/2008    s/p pneumonia, on ventilator and treacheotomy  . Wears glasses   . Venous stasis dermatitis   . Morbid obesity   . OSA (obstructive sleep apnea)   . CHF (congestive heart failure) 09/02/2008  . Elephantiasis   . Lymphedema of lower extremity     chronic, legs    No Known Allergies   Review of Systems ROS reviewed and was negative other than noted in HPI or above.    Objective:   Physical Exam  Of note, extremely foul odor upon entering the room, suggestive of stasis ulcer or infection General appearance: alert, no distress, morbidly obese AA male Neck: bandaid over his tracheotomy Skin: thickened dry, lichenified skin throughout lower legs, large 10 cm x 12cm somewhat squarish patch of malodorous erythematous raw skin on both lower posterolateral legs.  Acanthosis nigricans of neck.   Heart: RRR,normal S1, S2, no murmurs Lungs:  CTA Ext: generalized lymphedema of bilat LE Pulses unable to palpate lower extremity pulses due to the lymphedema   Assessment and Plan :     Encounter Diagnoses  Name Primary?  . Stasis dermatitis of both legs Yes  . Lymphedema of lower extremity   . Elephantiasis   . Morbid obesity   . OSA (obstructive sleep apnea)   . History of CHF (congestive heart failure)     Reviewed his prior 08/2008 hospitalization notes.  He was 470 lb at that time, was hypoxemic, in respiratory failure, admitted for pneumonia on hypoxemia, placed on ventilator and had subsequent tracheotomy placed.    Reviewed dermatology records from 03/08/2012.   He was advised to use 1/4 cup bleach in bath water 2-3 times weekly for 20 minutes, advised to apply triamcinolone cream to the 2 ulcerated patches, begin back on Lasix 40mg  daily and wrap legs with ace bandages daily.   Morbid obesity - discussed his current limitations, weight, and need to get this long term under better control  OSA  - needs to be on CPAP, but is not currently using CPAP.  Hx/o CHF.  He should have a cardiac eval at some point in the future.  Discussed case with Dr. Susann Givens, supervising physician who also examined pt.  We will refer to Wound Clinic for help in treating this difficult case.

## 2012-03-26 ENCOUNTER — Telehealth: Payer: Self-pay | Admitting: Family Medicine

## 2012-03-26 NOTE — Telephone Encounter (Signed)
I  Fax over the referral to the wound clinic so the patient can be seen. They will call us back with the patient appointment day and time. CLS

## 2012-03-26 NOTE — Telephone Encounter (Signed)
Message copied by Janeice Robinson on Mon Mar 26, 2012  2:27 PM ------      Message from: [place], [place] S      Created: Fri Mar 23, 2012  8:10 PM       Refer to wound center at [facility]

## 2012-04-20 ENCOUNTER — Encounter (HOSPITAL_BASED_OUTPATIENT_CLINIC_OR_DEPARTMENT_OTHER): Payer: Self-pay | Attending: General Surgery

## 2012-04-20 DIAGNOSIS — Z79899 Other long term (current) drug therapy: Secondary | ICD-10-CM | POA: Insufficient documentation

## 2012-04-20 DIAGNOSIS — J449 Chronic obstructive pulmonary disease, unspecified: Secondary | ICD-10-CM | POA: Insufficient documentation

## 2012-04-20 DIAGNOSIS — L98499 Non-pressure chronic ulcer of skin of other sites with unspecified severity: Secondary | ICD-10-CM | POA: Insufficient documentation

## 2012-04-20 DIAGNOSIS — J4489 Other specified chronic obstructive pulmonary disease: Secondary | ICD-10-CM | POA: Insufficient documentation

## 2012-04-20 DIAGNOSIS — I10 Essential (primary) hypertension: Secondary | ICD-10-CM | POA: Insufficient documentation

## 2012-04-20 DIAGNOSIS — I872 Venous insufficiency (chronic) (peripheral): Secondary | ICD-10-CM | POA: Insufficient documentation

## 2012-04-20 DIAGNOSIS — I890 Lymphedema, not elsewhere classified: Secondary | ICD-10-CM | POA: Insufficient documentation

## 2012-04-20 DIAGNOSIS — L259 Unspecified contact dermatitis, unspecified cause: Secondary | ICD-10-CM | POA: Insufficient documentation

## 2012-04-20 DIAGNOSIS — G4730 Sleep apnea, unspecified: Secondary | ICD-10-CM | POA: Insufficient documentation

## 2012-04-21 NOTE — Progress Notes (Signed)
[facility]  NAME:  NOMAR, BROAD NO.:  [id number]  MEDICAL RECORD NO.:  [id number]      DATE OF BIRTH:  [birth year]  PHYSICIAN:  Ardath Sax, M.D.      VISIT DATE:  04/20/2012                                  OFFICE VISIT   Tony Mccullough is a 31 year old, morbidly obese, African-American male who comes to [place] because of marked venous stasis, lymphedema with bilateral venous ulcers.  He has a history of hypertension along with his obesity. He has been seen by his doctor and sent here because of these ulcers. When we examined him here, his blood pressure is 123/80, pulse 80, respirations 16, temperature 98.6.  He is not a diabetic, but he is morbidly obese with the elephantiasis skin of chronic dermatitis.  His doctor recently put him on Lasix 40 mg a day.  On examination, his lungs are relatively clear.  Now, he has a history of COPD and because of his obesity, he has trouble with sleep apnea.  We are planning on ordering him lymphedema pumps to have at home to use 2 hours a day and in the meantime, we put silver alginate on his venous stasis wounds and put him in Unna boots.  He will come back here in a week.     Ardath Sax, M.D.     PP/MEDQ  D:  04/20/2012  T:  04/21/2012  Job:  [id number]

## 2012-05-11 ENCOUNTER — Encounter (HOSPITAL_BASED_OUTPATIENT_CLINIC_OR_DEPARTMENT_OTHER): Payer: Self-pay | Attending: General Surgery

## 2012-05-11 DIAGNOSIS — I87309 Chronic venous hypertension (idiopathic) without complications of unspecified lower extremity: Secondary | ICD-10-CM | POA: Insufficient documentation

## 2012-05-11 DIAGNOSIS — I890 Lymphedema, not elsewhere classified: Secondary | ICD-10-CM | POA: Insufficient documentation

## 2012-05-11 DIAGNOSIS — I872 Venous insufficiency (chronic) (peripheral): Secondary | ICD-10-CM | POA: Insufficient documentation

## 2012-05-11 DIAGNOSIS — L97809 Non-pressure chronic ulcer of other part of unspecified lower leg with unspecified severity: Secondary | ICD-10-CM | POA: Insufficient documentation

## 2012-05-18 ENCOUNTER — Encounter (HOSPITAL_BASED_OUTPATIENT_CLINIC_OR_DEPARTMENT_OTHER): Payer: Self-pay

## 2012-06-15 ENCOUNTER — Encounter (HOSPITAL_BASED_OUTPATIENT_CLINIC_OR_DEPARTMENT_OTHER): Payer: Self-pay | Attending: General Surgery

## 2012-06-15 DIAGNOSIS — L97909 Non-pressure chronic ulcer of unspecified part of unspecified lower leg with unspecified severity: Secondary | ICD-10-CM | POA: Insufficient documentation

## 2012-06-15 DIAGNOSIS — I890 Lymphedema, not elsewhere classified: Secondary | ICD-10-CM | POA: Insufficient documentation

## 2012-07-13 ENCOUNTER — Encounter (HOSPITAL_BASED_OUTPATIENT_CLINIC_OR_DEPARTMENT_OTHER): Payer: Self-pay | Attending: General Surgery

## 2012-07-13 DIAGNOSIS — I890 Lymphedema, not elsewhere classified: Secondary | ICD-10-CM | POA: Insufficient documentation

## 2012-07-13 DIAGNOSIS — L97909 Non-pressure chronic ulcer of unspecified part of unspecified lower leg with unspecified severity: Secondary | ICD-10-CM | POA: Insufficient documentation

## 2012-08-10 ENCOUNTER — Encounter (HOSPITAL_BASED_OUTPATIENT_CLINIC_OR_DEPARTMENT_OTHER): Payer: Self-pay | Attending: General Surgery

## 2012-08-10 DIAGNOSIS — I872 Venous insufficiency (chronic) (peripheral): Secondary | ICD-10-CM | POA: Insufficient documentation

## 2012-08-10 DIAGNOSIS — I890 Lymphedema, not elsewhere classified: Secondary | ICD-10-CM | POA: Insufficient documentation

## 2012-08-10 DIAGNOSIS — L97809 Non-pressure chronic ulcer of other part of unspecified lower leg with unspecified severity: Secondary | ICD-10-CM | POA: Insufficient documentation

## 2012-08-10 DIAGNOSIS — I87309 Chronic venous hypertension (idiopathic) without complications of unspecified lower extremity: Secondary | ICD-10-CM | POA: Insufficient documentation

## 2012-09-07 ENCOUNTER — Encounter (HOSPITAL_BASED_OUTPATIENT_CLINIC_OR_DEPARTMENT_OTHER): Payer: Self-pay | Attending: General Surgery

## 2012-09-07 DIAGNOSIS — I890 Lymphedema, not elsewhere classified: Secondary | ICD-10-CM | POA: Insufficient documentation

## 2012-09-07 DIAGNOSIS — L97909 Non-pressure chronic ulcer of unspecified part of unspecified lower leg with unspecified severity: Secondary | ICD-10-CM | POA: Insufficient documentation

## 2012-09-07 DIAGNOSIS — I87319 Chronic venous hypertension (idiopathic) with ulcer of unspecified lower extremity: Secondary | ICD-10-CM | POA: Insufficient documentation

## 2012-10-12 ENCOUNTER — Encounter (HOSPITAL_BASED_OUTPATIENT_CLINIC_OR_DEPARTMENT_OTHER): Payer: Self-pay | Attending: General Surgery

## 2012-10-12 DIAGNOSIS — L97909 Non-pressure chronic ulcer of unspecified part of unspecified lower leg with unspecified severity: Secondary | ICD-10-CM | POA: Insufficient documentation

## 2012-10-12 DIAGNOSIS — I87319 Chronic venous hypertension (idiopathic) with ulcer of unspecified lower extremity: Secondary | ICD-10-CM | POA: Insufficient documentation

## 2012-10-12 DIAGNOSIS — I890 Lymphedema, not elsewhere classified: Secondary | ICD-10-CM | POA: Insufficient documentation

## 2012-11-08 ENCOUNTER — Encounter (HOSPITAL_BASED_OUTPATIENT_CLINIC_OR_DEPARTMENT_OTHER): Payer: Self-pay | Attending: General Surgery

## 2012-11-08 DIAGNOSIS — I87309 Chronic venous hypertension (idiopathic) without complications of unspecified lower extremity: Secondary | ICD-10-CM | POA: Insufficient documentation

## 2012-11-08 DIAGNOSIS — I890 Lymphedema, not elsewhere classified: Secondary | ICD-10-CM | POA: Insufficient documentation

## 2012-11-08 DIAGNOSIS — I872 Venous insufficiency (chronic) (peripheral): Secondary | ICD-10-CM | POA: Insufficient documentation

## 2012-11-08 DIAGNOSIS — L97809 Non-pressure chronic ulcer of other part of unspecified lower leg with unspecified severity: Secondary | ICD-10-CM | POA: Insufficient documentation

## 2012-12-07 ENCOUNTER — Encounter (HOSPITAL_BASED_OUTPATIENT_CLINIC_OR_DEPARTMENT_OTHER): Payer: Self-pay | Attending: General Surgery

## 2012-12-07 DIAGNOSIS — L97909 Non-pressure chronic ulcer of unspecified part of unspecified lower leg with unspecified severity: Secondary | ICD-10-CM | POA: Insufficient documentation

## 2012-12-07 DIAGNOSIS — I890 Lymphedema, not elsewhere classified: Secondary | ICD-10-CM | POA: Insufficient documentation

## 2013-01-11 ENCOUNTER — Encounter (HOSPITAL_BASED_OUTPATIENT_CLINIC_OR_DEPARTMENT_OTHER): Payer: Self-pay | Attending: General Surgery

## 2013-01-11 DIAGNOSIS — I890 Lymphedema, not elsewhere classified: Secondary | ICD-10-CM | POA: Insufficient documentation

## 2013-01-11 DIAGNOSIS — L97909 Non-pressure chronic ulcer of unspecified part of unspecified lower leg with unspecified severity: Secondary | ICD-10-CM | POA: Insufficient documentation

## 2013-01-11 DIAGNOSIS — I87319 Chronic venous hypertension (idiopathic) with ulcer of unspecified lower extremity: Secondary | ICD-10-CM | POA: Insufficient documentation

## 2013-02-08 ENCOUNTER — Encounter (HOSPITAL_BASED_OUTPATIENT_CLINIC_OR_DEPARTMENT_OTHER): Payer: Self-pay | Attending: General Surgery

## 2013-02-08 DIAGNOSIS — I87319 Chronic venous hypertension (idiopathic) with ulcer of unspecified lower extremity: Secondary | ICD-10-CM | POA: Insufficient documentation

## 2013-02-08 DIAGNOSIS — L97909 Non-pressure chronic ulcer of unspecified part of unspecified lower leg with unspecified severity: Secondary | ICD-10-CM | POA: Insufficient documentation

## 2013-03-15 ENCOUNTER — Encounter (HOSPITAL_BASED_OUTPATIENT_CLINIC_OR_DEPARTMENT_OTHER): Payer: Self-pay | Attending: General Surgery

## 2013-03-15 DIAGNOSIS — I890 Lymphedema, not elsewhere classified: Secondary | ICD-10-CM | POA: Insufficient documentation

## 2013-03-15 DIAGNOSIS — L97909 Non-pressure chronic ulcer of unspecified part of unspecified lower leg with unspecified severity: Secondary | ICD-10-CM | POA: Insufficient documentation

## 2013-03-15 DIAGNOSIS — I87319 Chronic venous hypertension (idiopathic) with ulcer of unspecified lower extremity: Secondary | ICD-10-CM | POA: Insufficient documentation

## 2013-04-12 ENCOUNTER — Encounter (HOSPITAL_BASED_OUTPATIENT_CLINIC_OR_DEPARTMENT_OTHER): Payer: Self-pay | Attending: General Surgery

## 2013-04-12 DIAGNOSIS — L97909 Non-pressure chronic ulcer of unspecified part of unspecified lower leg with unspecified severity: Secondary | ICD-10-CM | POA: Insufficient documentation

## 2013-04-12 DIAGNOSIS — I890 Lymphedema, not elsewhere classified: Secondary | ICD-10-CM | POA: Insufficient documentation

## 2013-04-12 DIAGNOSIS — I87319 Chronic venous hypertension (idiopathic) with ulcer of unspecified lower extremity: Secondary | ICD-10-CM | POA: Insufficient documentation

## 2013-05-10 ENCOUNTER — Encounter (HOSPITAL_BASED_OUTPATIENT_CLINIC_OR_DEPARTMENT_OTHER): Payer: No Typology Code available for payment source | Attending: General Surgery

## 2013-05-10 DIAGNOSIS — I890 Lymphedema, not elsewhere classified: Secondary | ICD-10-CM | POA: Insufficient documentation

## 2013-05-10 DIAGNOSIS — I87339 Chronic venous hypertension (idiopathic) with ulcer and inflammation of unspecified lower extremity: Secondary | ICD-10-CM | POA: Insufficient documentation

## 2013-05-10 DIAGNOSIS — L97909 Non-pressure chronic ulcer of unspecified part of unspecified lower leg with unspecified severity: Principal | ICD-10-CM

## 2013-06-14 ENCOUNTER — Encounter (HOSPITAL_BASED_OUTPATIENT_CLINIC_OR_DEPARTMENT_OTHER): Payer: No Typology Code available for payment source | Attending: General Surgery

## 2013-06-14 DIAGNOSIS — L98499 Non-pressure chronic ulcer of skin of other sites with unspecified severity: Secondary | ICD-10-CM | POA: Insufficient documentation

## 2013-06-14 DIAGNOSIS — I10 Essential (primary) hypertension: Secondary | ICD-10-CM | POA: Insufficient documentation

## 2013-06-14 DIAGNOSIS — L259 Unspecified contact dermatitis, unspecified cause: Secondary | ICD-10-CM | POA: Insufficient documentation

## 2013-06-14 DIAGNOSIS — G4730 Sleep apnea, unspecified: Secondary | ICD-10-CM | POA: Insufficient documentation

## 2013-06-14 DIAGNOSIS — I890 Lymphedema, not elsewhere classified: Secondary | ICD-10-CM | POA: Insufficient documentation

## 2013-06-14 DIAGNOSIS — Z79899 Other long term (current) drug therapy: Secondary | ICD-10-CM | POA: Insufficient documentation

## 2013-06-14 DIAGNOSIS — I872 Venous insufficiency (chronic) (peripheral): Secondary | ICD-10-CM | POA: Insufficient documentation

## 2013-07-12 ENCOUNTER — Encounter (HOSPITAL_BASED_OUTPATIENT_CLINIC_OR_DEPARTMENT_OTHER): Payer: No Typology Code available for payment source | Attending: General Surgery

## 2013-07-12 DIAGNOSIS — L97809 Non-pressure chronic ulcer of other part of unspecified lower leg with unspecified severity: Secondary | ICD-10-CM | POA: Insufficient documentation

## 2013-07-12 DIAGNOSIS — I890 Lymphedema, not elsewhere classified: Secondary | ICD-10-CM | POA: Insufficient documentation

## 2013-08-01 ENCOUNTER — Emergency Department (HOSPITAL_COMMUNITY): Payer: No Typology Code available for payment source

## 2013-08-01 ENCOUNTER — Emergency Department (HOSPITAL_COMMUNITY)
Admission: EM | Admit: 2013-08-01 | Discharge: 2013-08-01 | Disposition: A | Discharge status: Home / Self Care | Payer: No Typology Code available for payment source | Attending: Emergency Medicine | Admitting: Emergency Medicine

## 2013-08-01 ENCOUNTER — Ambulatory Visit (INDEPENDENT_AMBULATORY_CARE_PROVIDER_SITE_OTHER): Payer: No Typology Code available for payment source | Admitting: Family Medicine

## 2013-08-01 ENCOUNTER — Ambulatory Visit: Payer: Self-pay

## 2013-08-01 VITALS — BP 134/100 | HR 93 | Temp 98.0°F | Resp 20 | Ht 70.75 in | Wt >= 6400 oz

## 2013-08-01 DIAGNOSIS — Z8701 Personal history of pneumonia (recurrent): Secondary | ICD-10-CM | POA: Insufficient documentation

## 2013-08-01 DIAGNOSIS — R0989 Other specified symptoms and signs involving the circulatory and respiratory systems: Principal | ICD-10-CM | POA: Insufficient documentation

## 2013-08-01 DIAGNOSIS — I509 Heart failure, unspecified: Secondary | ICD-10-CM | POA: Insufficient documentation

## 2013-08-01 DIAGNOSIS — Z79899 Other long term (current) drug therapy: Secondary | ICD-10-CM | POA: Insufficient documentation

## 2013-08-01 DIAGNOSIS — I10 Essential (primary) hypertension: Secondary | ICD-10-CM

## 2013-08-01 DIAGNOSIS — R0600 Dyspnea, unspecified: Secondary | ICD-10-CM

## 2013-08-01 DIAGNOSIS — R609 Edema, unspecified: Secondary | ICD-10-CM

## 2013-08-01 DIAGNOSIS — Z8669 Personal history of other diseases of the nervous system and sense organs: Secondary | ICD-10-CM | POA: Insufficient documentation

## 2013-08-01 DIAGNOSIS — I890 Lymphedema, not elsewhere classified: Secondary | ICD-10-CM

## 2013-08-01 DIAGNOSIS — R1013 Epigastric pain: Secondary | ICD-10-CM

## 2013-08-01 DIAGNOSIS — R0789 Other chest pain: Secondary | ICD-10-CM | POA: Insufficient documentation

## 2013-08-01 DIAGNOSIS — R0602 Shortness of breath: Secondary | ICD-10-CM

## 2013-08-01 DIAGNOSIS — R0609 Other forms of dyspnea: Secondary | ICD-10-CM | POA: Insufficient documentation

## 2013-08-01 DIAGNOSIS — G4733 Obstructive sleep apnea (adult) (pediatric): Secondary | ICD-10-CM

## 2013-08-01 LAB — BASIC METABOLIC PANEL
BUN: 8 mg/dL (ref 6–23)
CALCIUM: 8.8 mg/dL (ref 8.4–10.5)
CHLORIDE: 97 meq/L (ref 96–112)
CO2: 34 mEq/L — ABNORMAL HIGH (ref 19–32)
Creatinine, Ser: 0.76 mg/dL (ref 0.50–1.35)
GFR calc Af Amer: >90 mL/min (ref >90)
GFR calc non Af Amer: >90 mL/min (ref >90)
Glucose, Bld: 97 mg/dL (ref 70–99)
Potassium: 3.9 mEq/L (ref 3.7–5.3)
SODIUM: 140 meq/L (ref 137–147)

## 2013-08-01 LAB — CBC WITH DIFFERENTIAL/PLATELET
BASOS ABS: 0.1 10*3/uL (ref 0.0–0.1)
Basophils Relative: 1 % (ref 0–1)
Eosinophils Absolute: 0.3 10*3/uL (ref 0.0–0.7)
Eosinophils Relative: 3 % (ref 0–5)
HCT: 44.8 % (ref 39.0–52.0)
Hemoglobin: 15 g/dL (ref 13.0–17.0)
LYMPHS PCT: 16 % (ref 12–46)
Lymphs Abs: 1.5 10*3/uL (ref 0.7–4.0)
MCH: 32.3 pg (ref 26.0–34.0)
MCHC: 33.5 g/dL (ref 30.0–36.0)
MCV: 96.3 fL (ref 78.0–100.0)
Monocytes Absolute: 1 10*3/uL (ref 0.1–1.0)
Monocytes Relative: 11 % (ref 3–12)
Neutro Abs: 6.6 10*3/uL (ref 1.7–7.7)
Neutrophils Relative %: 70 % (ref 43–77)
PLATELETS: 198 10*3/uL (ref 150–400)
RBC: 4.65 MIL/uL (ref 4.22–5.81)
RDW: 15.5 % (ref 11.5–15.5)
WBC: 9.5 10*3/uL (ref 4.0–10.5)

## 2013-08-01 LAB — PRO B NATRIURETIC PEPTIDE: PRO B NATRI PEPTIDE: 26.9 pg/mL (ref 0–125)

## 2013-08-01 LAB — TROPONIN I

## 2013-08-01 LAB — D-DIMER, QUANTITATIVE: D-Dimer, Quant: 0.4 ug/mL-FEU (ref 0.00–0.48)

## 2013-08-01 MED ORDER — FUROSEMIDE 40 MG PO TABS: 40.0000 mg | ORAL_TABLET | Freq: Every day | ORAL | Status: DC

## 2013-08-01 MED ORDER — FUROSEMIDE 10 MG/ML IJ SOLN
40.0000 mg | Freq: Once | INTRAMUSCULAR | Status: AC
  Administered 2013-08-01: 40 mg via INTRAVENOUS
  Filled 2013-08-01: qty 4

## 2013-08-01 NOTE — Patient Instructions (Signed)
[facility].

## 2013-08-01 NOTE — Discharge Instructions (Signed)
Will start fluid pill.    Try to work on exercise and weight loss.      Decrease salt in your diet. Eat a banana or orange daily.  Make sure you followup with your primary care Dr.

## 2013-08-01 NOTE — ED Notes (Signed)
Pt stated that he has been exp. Sob and chest tightness when walking and coughing while at rest, but not coughing anything up. Pt currently denies exp any sob or chest tightness right now. Pt denies any nausea or vomiting. No s/s of distress noted.

## 2013-08-01 NOTE — ED Notes (Signed)
Pt returned from xray

## 2013-08-01 NOTE — Progress Notes (Signed)
Subjective: 33 year old man who comes to the office with a 2-1/2 week history of increasing shortness of breath. He is getting chest tightness. The tightness gets worse with exertion. He has a history of chronic lymphedema of his legs, and goes to the wound care care center for this tight dressing every week. He does not have a primary care doctor. He was in the hospital several years ago with respiratory failure he ended up on a ventilator for one month with a tracheotomy. He has a healed trach in his neck. Apparently he has a history of sleep apnea though he doesn't always wear his CPAP. They have been undergoing a move and he has not used his pumps for his legs the last couple of weeks.  Past medical history: Lymphedema History of enlarged heart Lymphedema His tracheostomy No regular medications. No allergies  Family history: Mother has diabetes  Social history: Does not work. Mostly sits around. Doesn't get a lot of exercise. Weight is about 50 pounds over the last year. Only doctor he goes to his at the wound care center.   Review of systems: Constitutional: No imaging Respiratory: Shortness of breath, especially on minimal exertion Cardiovascular: Chest tightness without complications GI: Epigastric pains in the lower chest GU: Unremarkable Musculoskeletal : Lymphedema and leg pains Dermatologic: Leg problems from the lymphedema. Also the tracheostomy is still not fully healed out. Neurologic: Unremarkable. Does have history of sleep apnea Psychiatric: Unremarkable Endocrine: Not known to be diabetic       Objective: Morbidly obese man who is somewhat short of breath. His TMs are normal. Throat clear. Has ultra Leonette MonarchGaston he wound which is almost healed. Chest is clear to auscultation. Heart regular without murmurs. Abdomen obese, soft. A little tenderness in the hypogastric region. Extremities cannot be examined you to wraps from our wound center for edema.  O2 sat 89-91 at  rest, 85 with minimal ambulation in the hall. When he walked he started complaining of chest tightness again.  Assessment: Hypoxemia Chest tightness Morbid obesity Lymphedema History of tracheostomy, healed Hypertension poorly controlled Sleep apnea Sleep apnea  Plan: EKG has no acute changes. Sent to the hospital by EMS. They arrive before the labs were chest is going to be done. Spoke with his mother on the telephone. Told her [facility]

## 2013-08-04 NOTE — ED Provider Notes (Signed)
CSN: [id number]     Arrival date & time 08/01/13  1144 History   First MD Initiated Contact with Patient 08/01/13 1156     Chief Complaint  Patient presents with  . Shortness of Breath  . Chest Pain     (Consider location/radiation/quality/duration/timing/severity/associated sxs/prior Treatment) HPI.... long-standing dyspnea with exertion now complains of persistent symptoms with some chest discomfort. He is morbidly obese. These are not a new symptoms. Exertion makes symptoms worse. Severity is moderate.  Past Medical History  Diagnosis Date  . Hypertension   . Respiratory failure 09/02/2008    s/p pneumonia, on ventilator and treacheotomy  . Wears glasses   . Venous stasis dermatitis   . Morbid obesity   . OSA (obstructive sleep apnea)   . CHF (congestive heart failure) 09/02/2008  . Elephantiasis   . Lymphedema of lower extremity     chronic, legs  . Enlarged heart    Past Surgical History  Procedure Laterality Date  . Tracheostomy     Family History  Problem Relation Age of Onset  . Hypertension    . Heart failure    . Diabetes Mother   . Heart disease Sister   . Hyperlipidemia Sister    History  Substance Use Topics  . Smoking status: Never Smoker   . Smokeless tobacco: Not on file  . Alcohol Use: No    Review of Systems  All other systems reviewed and are negative.      Allergies  Review of patient's allergies indicates no known allergies.  Home Medications   Current Outpatient Rx  Name  Route  Sig  Dispense  Refill  . furosemide (LASIX) 40 MG tablet   Oral   Take 1 tablet (40 mg total) by mouth daily.   30 tablet   1    BP 137/71  Pulse 87  Temp(Src) 97.4 F (36.3 C) (Oral)  Resp 12  SpO2 93% Physical Exam  Nursing note and vitals reviewed. Constitutional: He is oriented to person, place, and time.  Morbidly obese  HENT:  Head: Normocephalic and atraumatic.  Eyes: Conjunctivae and EOM are normal. Pupils are equal, round, and  reactive to light.  Neck: Normal range of motion. Neck supple.  Cardiovascular: Normal rate, regular rhythm and normal heart sounds.   Pulmonary/Chest: Effort normal and breath sounds normal.  Abdominal: Soft. Bowel sounds are normal.  Musculoskeletal: Normal range of motion.  Neurological: He is alert and oriented to person, place, and time.  Skin:  Venous stasis ulcers in bilateral extremities  Psychiatric: He has a normal mood and affect. His behavior is normal.    ED Course  Procedures (including critical care time) Labs Review Labs Reviewed  BASIC METABOLIC PANEL - Abnormal; Notable for the following:    CO2 34 (*)    All other components within normal limits  CBC WITH DIFFERENTIAL  TROPONIN I  PRO B NATRIURETIC PEPTIDE  D-DIMER, QUANTITATIVE   Imaging Review No results found.   EKG Interpretation   Date/Time:  Thursday August 01 2013 11:50:39 EDT Ventricular Rate:  94 PR Interval:  152 QRS Duration: 97 QT Interval:  374 QTC Calculation: 468 R Axis:   68 Text Interpretation:  Sinus rhythm Minimal ST depression, inferior leads  Confirmed by Taya Ashbaugh  MD, Dillyn Joaquin ([id number]) on 08/01/2013 12:38:22 PM      MDM   Final diagnoses:  Dyspnea  Morbid obesity    I do not believe any these findings are new. Patient is morbidly obese.  He is trying to lose weight. He will get primary care followup. By mouth Lasix started.    Donnetta HutchingBrian Bentley Fissel, MD 08/04/13 [phone]

## 2013-08-09 ENCOUNTER — Encounter (HOSPITAL_BASED_OUTPATIENT_CLINIC_OR_DEPARTMENT_OTHER): Payer: No Typology Code available for payment source | Attending: General Surgery

## 2013-08-09 DIAGNOSIS — L97509 Non-pressure chronic ulcer of other part of unspecified foot with unspecified severity: Secondary | ICD-10-CM | POA: Insufficient documentation

## 2013-08-09 DIAGNOSIS — I872 Venous insufficiency (chronic) (peripheral): Secondary | ICD-10-CM | POA: Insufficient documentation

## 2013-09-06 ENCOUNTER — Encounter (HOSPITAL_BASED_OUTPATIENT_CLINIC_OR_DEPARTMENT_OTHER): Payer: No Typology Code available for payment source | Attending: General Surgery

## 2013-09-06 DIAGNOSIS — I890 Lymphedema, not elsewhere classified: Secondary | ICD-10-CM | POA: Insufficient documentation

## 2013-09-06 DIAGNOSIS — L97809 Non-pressure chronic ulcer of other part of unspecified lower leg with unspecified severity: Secondary | ICD-10-CM | POA: Insufficient documentation

## 2013-09-06 DIAGNOSIS — I87339 Chronic venous hypertension (idiopathic) with ulcer and inflammation of unspecified lower extremity: Secondary | ICD-10-CM | POA: Insufficient documentation

## 2013-09-06 DIAGNOSIS — L97909 Non-pressure chronic ulcer of unspecified part of unspecified lower leg with unspecified severity: Principal | ICD-10-CM

## 2013-09-13 ENCOUNTER — Ambulatory Visit: Payer: No Typology Code available for payment source | Admitting: Physician Assistant

## 2013-10-08 ENCOUNTER — Ambulatory Visit (INDEPENDENT_AMBULATORY_CARE_PROVIDER_SITE_OTHER): Payer: No Typology Code available for payment source | Admitting: Internal Medicine

## 2013-10-08 ENCOUNTER — Other Ambulatory Visit (INDEPENDENT_AMBULATORY_CARE_PROVIDER_SITE_OTHER): Payer: No Typology Code available for payment source

## 2013-10-08 ENCOUNTER — Encounter: Payer: Self-pay | Admitting: Internal Medicine

## 2013-10-08 DIAGNOSIS — I83029 Varicose veins of left lower extremity with ulcer of unspecified site: Secondary | ICD-10-CM

## 2013-10-08 DIAGNOSIS — L97909 Non-pressure chronic ulcer of unspecified part of unspecified lower leg with unspecified severity: Secondary | ICD-10-CM

## 2013-10-08 DIAGNOSIS — I872 Venous insufficiency (chronic) (peripheral): Secondary | ICD-10-CM

## 2013-10-08 DIAGNOSIS — L97919 Non-pressure chronic ulcer of unspecified part of right lower leg with unspecified severity: Secondary | ICD-10-CM

## 2013-10-08 DIAGNOSIS — Z0000 Encounter for general adult medical examination without abnormal findings: Secondary | ICD-10-CM

## 2013-10-08 DIAGNOSIS — Z23 Encounter for immunization: Secondary | ICD-10-CM

## 2013-10-08 DIAGNOSIS — L97929 Non-pressure chronic ulcer of unspecified part of left lower leg with unspecified severity: Secondary | ICD-10-CM

## 2013-10-08 DIAGNOSIS — I83019 Varicose veins of right lower extremity with ulcer of unspecified site: Secondary | ICD-10-CM | POA: Insufficient documentation

## 2013-10-08 DIAGNOSIS — J9610 Chronic respiratory failure, unspecified whether with hypoxia or hypercapnia: Secondary | ICD-10-CM

## 2013-10-08 DIAGNOSIS — G4733 Obstructive sleep apnea (adult) (pediatric): Secondary | ICD-10-CM

## 2013-10-08 DIAGNOSIS — I509 Heart failure, unspecified: Secondary | ICD-10-CM

## 2013-10-08 DIAGNOSIS — I10 Essential (primary) hypertension: Secondary | ICD-10-CM

## 2013-10-08 LAB — CBC WITH DIFFERENTIAL/PLATELET
Basophils Absolute: 0 10*3/uL (ref 0.0–0.1)
Basophils Relative: 0.3 % (ref 0.0–3.0)
Eosinophils Absolute: 0.2 10*3/uL (ref 0.0–0.7)
Eosinophils Relative: 2.9 % (ref 0.0–5.0)
HCT: 46.8 % (ref 39.0–52.0)
Hemoglobin: 15.8 g/dL (ref 13.0–17.0)
Lymphocytes Relative: 15.6 % (ref 12.0–46.0)
Lymphs Abs: 1.3 10*3/uL (ref 0.7–4.0)
MCHC: 33.7 g/dL (ref 30.0–36.0)
MCV: 92.9 fl (ref 78.0–100.0)
Monocytes Absolute: 1 10*3/uL (ref 0.1–1.0)
Monocytes Relative: 11.9 % (ref 3.0–12.0)
Neutro Abs: 5.9 10*3/uL (ref 1.4–7.7)
Neutrophils Relative %: 69.3 % (ref 43.0–77.0)
Platelets: 207 10*3/uL (ref 150.0–400.0)
RBC: 5.04 Mil/uL (ref 4.22–5.81)
RDW: 16.1 % — ABNORMAL HIGH (ref 11.5–15.5)
WBC: 8.5 10*3/uL (ref 4.0–10.5)

## 2013-10-08 LAB — COMPREHENSIVE METABOLIC PANEL
ALBUMIN: 3.4 g/dL — AB (ref 3.5–5.2)
ALT: 29 U/L (ref 0–53)
AST: 29 U/L (ref 0–37)
Alkaline Phosphatase: 73 U/L (ref 39–117)
BUN: 12 mg/dL (ref 6–23)
CALCIUM: 9.4 mg/dL (ref 8.4–10.5)
CHLORIDE: 96 meq/L (ref 96–112)
CO2: 34 meq/L — AB (ref 19–32)
Creatinine, Ser: 0.9 mg/dL (ref 0.4–1.5)
GFR: 119.05 mL/min (ref >60.00)
GLUCOSE: 99 mg/dL (ref 70–99)
POTASSIUM: 3.9 meq/L (ref 3.5–5.1)
SODIUM: 137 meq/L (ref 135–145)
TOTAL PROTEIN: 8.4 g/dL — AB (ref 6.0–8.3)
Total Bilirubin: 0.6 mg/dL (ref 0.2–1.2)

## 2013-10-08 LAB — LIPID PANEL
Cholesterol: 115 mg/dL (ref 0–200)
HDL: 28.5 mg/dL — ABNORMAL LOW (ref >39.00)
LDL Cholesterol: 67 mg/dL (ref 0–99)
Total CHOL/HDL Ratio: 4
Triglycerides: 98 mg/dL (ref 0.0–149.0)
VLDL: 19.6 mg/dL (ref 0.0–40.0)

## 2013-10-08 LAB — TSH: TSH: 2.04 u[IU]/mL (ref 0.35–4.50)

## 2013-10-08 LAB — HEMOGLOBIN A1C: Hgb A1c MFr Bld: 6.3 % (ref 4.6–6.5)

## 2013-10-08 MED ORDER — NEBIVOLOL HCL 5 MG PO TABS: 5.0000 mg | ORAL_TABLET | Freq: Every day | ORAL | Status: DC

## 2013-10-08 MED ORDER — FUROSEMIDE 40 MG PO TABS: 40.0000 mg | ORAL_TABLET | Freq: Two times a day (BID) | ORAL | Status: DC

## 2013-10-08 NOTE — Patient Instructions (Signed)
Health Maintenance, Males A healthy lifestyle and preventative care can promote health and wellness.  Maintain regular health, dental, and eye exams.  Eat a healthy diet. Foods like vegetables, fruits, whole grains, low-fat dairy products, and lean protein foods contain the nutrients you need and are low in calories. Decrease your intake of foods high in solid fats, added sugars, and salt. Get information about a proper diet from your health care provider, if necessary.  Regular physical exercise is one of the most important things you can do for your health. Most adults should get at least 150 minutes of moderate-intensity exercise (any activity that increases your heart rate and causes you to sweat) each week. In addition, most adults need muscle-strengthening exercises on 2 or more days a week.   Maintain a healthy weight. The body mass index (BMI) is a screening tool to identify possible weight problems. It provides an estimate of body fat based on height and weight. Your health care provider can find your BMI and can help you achieve or maintain a healthy weight. For males 20 years and older:  A BMI below 18.5 is considered underweight.  A BMI of 18.5 to 24.9 is normal.  A BMI of 25 to 29.9 is considered overweight.  A BMI of 30 and above is considered obese.  Maintain normal blood lipids and cholesterol by exercising and minimizing your intake of saturated fat. Eat a balanced diet with plenty of fruits and vegetables. Blood tests for lipids and cholesterol should begin at age 20 and be repeated every 5 years. If your lipid or cholesterol levels are high, you are over 50, or you are at high risk for heart disease, you may need your cholesterol levels checked more frequently.Ongoing high lipid and cholesterol levels should be treated with medicines, if diet and exercise are not working.  If you smoke, find out from your health care provider how to quit. If you do not use tobacco, do not  start.  Lung cancer screening is recommended for adults aged 55 80 years who are at high risk for developing lung cancer because of a history of smoking. A yearly low-dose CT scan of the lungs is recommended for people who have at least a 30-pack-year history of smoking and are a current smoker or have quit within the past 15 years. A pack year of smoking is smoking an average of 1 pack of cigarettes a day for 1 year (for example, a 30-pack-year history of smoking could mean smoking 1 pack a day for 30 years or 2 packs a day for 15 years). Yearly screening should continue until the smoker has stopped smoking for at least 15 years. Yearly screening should be stopped for people who develop a health problem that would prevent them from having lung cancer treatment.  If you choose to drink alcohol, do not have more than 2 drinks per day. One drink is considered to be 12 oz (360 mL) of beer, 5 oz (150 mL) of wine, or 1.5 oz (45 mL) of liquor.  Avoid use of street drugs. Do not share needles with anyone. Ask for help if you need support or instructions about stopping the use of drugs.  High blood pressure causes heart disease and increases the risk of stroke. Blood pressure should be checked at least every 1 2 years. Ongoing high blood pressure should be treated with medicines if weight loss and exercise are not effective.  If you are 45 33 years old, ask your health   care provider if you should take aspirin to prevent heart disease.  Diabetes screening involves taking a blood sample to check your fasting blood sugar level. This should be done once every 3 years after age 45, if you are at a normal weight and without risk factors for diabetes. Testing should be considered at a younger age or be carried out more frequently if you are overweight and have at least 1 risk factor for diabetes.  Colorectal cancer can be detected and often prevented. Most routine colorectal cancer screening begins at the age of 50  and continues through age 75. However, your health care provider may recommend screening at an earlier age if you have risk factors for colon cancer. On a yearly basis, your health care provider may provide home test kits to check for hidden blood in the stool. A small camera at the end of a tube may be used to directly examine the colon (sigmoidoscopy or colonoscopy) to detect the earliest forms of colorectal cancer. Talk to your health care provider about this at age 50, when routine screening begins. A direct exam of the colon should be repeated every 5 10 years through age 75, unless early forms of pre-cancerous polyps or small growths are found.  People who are at an increased risk for hepatitis B should be screened for this virus. You are considered at high risk for hepatitis B if:  You were born in a country where hepatitis B occurs often. Talk with your health care provider about which countries are considered high-risk.  Your parents were born in a high-risk country and you have not received a shot to protect against hepatitis B (hepatitis B vaccine).  You have HIV or AIDS.  You use needles to inject street drugs.  You live with, or have sex with, someone who has hepatitis B.  You are a man who has sex with other men (MSM).  You get hemodialysis treatment.  You take certain medicines for conditions like cancer, organ transplantation, and autoimmune conditions.  Hepatitis C blood testing is recommended for all people born from 1945 through 1965 and any individual with known risk factors for hepatitis C.  Healthy men should no longer receive prostate-specific antigen (PSA) blood tests as part of routine cancer screening. Talk to your health care provider about prostate cancer screening.  Testicular cancer screening is not recommended for adolescents or adult males who have no symptoms. Screening includes self-exam, a health care provider exam, and other screening tests. Consult with  your health care provider about any symptoms you have or any concerns you have about testicular cancer.  Practice safe sex. Use condoms and avoid high-risk sexual practices to reduce the spread of sexually transmitted infections (STIs).  Use sunscreen. Apply sunscreen liberally and repeatedly throughout the day. You should seek shade when your shadow is shorter than you. Protect yourself by wearing long sleeves, pants, a wide-brimmed hat, and sunglasses year round, whenever you are outdoors.  Tell your health care provider of new moles or changes in moles, especially if there is a change in shape or color. Also tell your provider if a mole is larger than the size of a pencil eraser.  A one-time screening for abdominal aortic aneurysm (AAA) and surgical repair of large AAAs by ultrasound is recommended for men aged 65 75 years who are current or former smokers.  Stay current with your vaccines (immunizations). Document Released: 10/22/2007 Document Revised: 02/13/2013 Document Reviewed: 09/20/2010 ExitCare Patient Information 2014 [employer].   Hypertension As your heart beats, it forces blood through your arteries. This force is your blood pressure. If the pressure is too high, it is called hypertension (HTN) or high blood pressure. HTN is dangerous because you may have it and not know it. High blood pressure may mean that your heart has to work harder to pump blood. Your arteries may be narrow or stiff. The extra work puts you at risk for heart disease, stroke, and other problems.  Blood pressure consists of two numbers, a higher number over a lower, 110/72, for example. It is stated as "110 over 72." The ideal is below 120 for the top number (systolic) and under 80 for the bottom (diastolic). Write down your blood pressure today. You should pay close attention to your blood pressure if you have certain conditions such as:  Heart failure.  Prior heart attack.  Diabetes  Chronic kidney  disease.  Prior stroke.  Multiple risk factors for heart disease. To see if you have HTN, your blood pressure should be measured while you are seated with your arm held at the level of the heart. It should be measured at least twice. A one-time elevated blood pressure reading (especially in the Emergency Department) does not mean that you need treatment. There may be conditions in which the blood pressure is different between your right and left arms. It is important to see your caregiver soon for a recheck. Most people have essential hypertension which means that there is not a specific cause. This type of high blood pressure may be lowered by changing lifestyle factors such as:  Stress.  Smoking.  Lack of exercise.  Excessive weight.  Drug/tobacco/alcohol use.  Eating less salt. Most people do not have symptoms from high blood pressure until it has caused damage to the body. Effective treatment can often prevent, delay or reduce that damage. TREATMENT  When a cause has been identified, treatment for high blood pressure is directed at the cause. There are a large number of medications to treat HTN. These fall into several categories, and your caregiver will help you select the medicines that are best for you. Medications may have side effects. You should review side effects with your caregiver. If your blood pressure stays high after you have made lifestyle changes or started on medicines,   Your medication(s) may need to be changed.  Other problems may need to be addressed.  Be certain you understand your prescriptions, and know how and when to take your medicine.  Be sure to follow up with your caregiver within the time frame advised (usually within two weeks) to have your blood pressure rechecked and to review your medications.  If you are taking more than one medicine to lower your blood pressure, make sure you know how and at what times they should be taken. Taking two medicines  at the same time can result in blood pressure that is too low. SEEK IMMEDIATE MEDICAL CARE IF:  You develop a severe headache, blurred or changing vision, or confusion.  You have unusual weakness or numbness, or a faint feeling.  You have severe chest or abdominal pain, vomiting, or breathing problems. MAKE SURE YOU:   Understand these instructions.  Will watch your condition.  Will get help right away if you are not doing well or get worse. Document Released: 04/25/2005 Document Revised: 07/18/2011 Document Reviewed: 12/14/2007 Exit[employer] Patient Information 2014 [employer].  

## 2013-10-08 NOTE — Assessment & Plan Note (Signed)
Will add bystolic for better BP control and I have asked him to see cardiology for further evaluation He will cont lasix but I think it should be dosed BID

## 2013-10-08 NOTE — Progress Notes (Signed)
Pre visit review using our clinic review tool, if applicable. No additional management support is needed unless otherwise documented below in the visit note. 

## 2013-10-08 NOTE — Progress Notes (Signed)
Subjective:    Patient ID: Tony Mccullough, male    DOB: 06/18/1980, 33 y.o.   MRN: [id number]  Hypertension This is a chronic problem. The current episode started more than 1 year ago. The problem is unchanged. The problem is uncontrolled. Associated symptoms include malaise/fatigue, peripheral edema and shortness of breath. Pertinent negatives include no anxiety, blurred vision, chest pain, headaches, neck pain, orthopnea, palpitations, PND or sweats. There are no associated agents to hypertension. Risk factors for coronary artery disease include male gender, obesity and sedentary lifestyle. Past treatments include diuretics. The current treatment provides moderate improvement. Compliance problems include diet and exercise.  Hypertensive end-organ damage includes heart failure. Identifiable causes of hypertension include sleep apnea.      Review of Systems  Constitutional: Positive for malaise/fatigue and fatigue. Negative for fever, chills, diaphoresis, activity change, appetite change and unexpected weight change.  HENT: Negative.   Eyes: Negative.  Negative for blurred vision.  Respiratory: Positive for shortness of breath. Negative for cough, choking, chest tightness, wheezing and stridor.   Cardiovascular: Positive for leg swelling. Negative for chest pain, palpitations, orthopnea and PND.  Gastrointestinal: Negative.  Negative for nausea, vomiting, abdominal pain, diarrhea, constipation and blood in stool.  Endocrine: Negative.  Negative for polydipsia, polyphagia and polyuria.  Genitourinary: Negative.   Musculoskeletal: Negative.  Negative for back pain, gait problem, joint swelling, myalgias, neck pain and neck stiffness.  Skin: Negative.  Negative for rash.  Allergic/Immunologic: Negative.   Neurological: Negative.  Negative for dizziness and headaches.  Hematological: Negative.  Negative for adenopathy. Does not bruise/bleed easily.  Psychiatric/Behavioral: Negative.          Objective:   Physical Exam  Vitals reviewed. Constitutional: He is oriented to person, place, and time. He appears well-developed and well-nourished.  Non-toxic appearance. He has a sickly appearance. He does not appear ill. No distress.  Morbid obesity Foul-smelling  HENT:  Head: Normocephalic and atraumatic.  Mouth/Throat: Oropharynx is clear and moist. No oropharyngeal exudate.  Eyes: Conjunctivae are normal. Right eye exhibits no discharge. Left eye exhibits no discharge. No scleral icterus.  Neck: Normal range of motion. Neck supple. No JVD present. No tracheal deviation present. No thyromegaly present.  Cardiovascular: Normal rate, regular rhythm, normal heart sounds and intact distal pulses.  Exam reveals no gallop and no friction rub.   No murmur heard. Pulmonary/Chest: Effort normal and breath sounds normal. No stridor. No respiratory distress. He has no wheezes. He has no rales. He exhibits no tenderness.  Abdominal: Soft. Bowel sounds are normal. He exhibits no distension and no mass. There is no tenderness. There is no rebound and no guarding. Hernia confirmed negative in the right inguinal area and confirmed negative in the left inguinal area.  Genitourinary: Penis normal. Right testis shows no mass, no swelling and no tenderness. Right testis is descended. Left testis shows no mass, no swelling and no tenderness. Left testis is descended. Circumcised. No penile erythema or penile tenderness. No discharge found.  Musculoskeletal: Normal range of motion. He exhibits edema (trace pitting edema in BLE). He exhibits no tenderness.  Both LE's are covered with UNNA boot dressings  Lymphadenopathy:    He has no cervical adenopathy.       Right: No inguinal adenopathy present.       Left: No inguinal adenopathy present.  Neurological: He is oriented to person, place, and time.  Skin: Skin is warm and dry. No rash noted. He is not diaphoretic. No erythema. No pallor.  Psychiatric: He  has a normal mood and affect. His behavior is normal. Judgment and thought content normal.          Assessment & Plan:

## 2013-10-08 NOTE — Assessment & Plan Note (Signed)
I have asked him to see pulm to have this evaluated and treated

## 2013-10-08 NOTE — Assessment & Plan Note (Signed)
His BP is not well controlled I will check his labs today to look for end organ damage and secondary causes of HTN He will cont lasix but dosed BID

## 2013-10-08 NOTE — Assessment & Plan Note (Signed)
Exam done Vaccines were updated Labs ordered Pt ed material was given 

## 2013-10-09 ENCOUNTER — Telehealth: Payer: Self-pay | Admitting: Internal Medicine

## 2013-10-09 NOTE — Telephone Encounter (Signed)
Relevant patient education assigned to patient using Emmi. ° °

## 2013-10-11 ENCOUNTER — Encounter (HOSPITAL_BASED_OUTPATIENT_CLINIC_OR_DEPARTMENT_OTHER): Payer: No Typology Code available for payment source | Attending: General Surgery

## 2013-10-11 DIAGNOSIS — I890 Lymphedema, not elsewhere classified: Secondary | ICD-10-CM | POA: Insufficient documentation

## 2013-10-11 DIAGNOSIS — L97809 Non-pressure chronic ulcer of other part of unspecified lower leg with unspecified severity: Secondary | ICD-10-CM | POA: Insufficient documentation

## 2013-11-05 ENCOUNTER — Encounter: Payer: Self-pay | Admitting: Internal Medicine

## 2013-11-11 ENCOUNTER — Encounter (HOSPITAL_BASED_OUTPATIENT_CLINIC_OR_DEPARTMENT_OTHER): Payer: No Typology Code available for payment source | Attending: General Surgery

## 2013-11-11 DIAGNOSIS — L97809 Non-pressure chronic ulcer of other part of unspecified lower leg with unspecified severity: Secondary | ICD-10-CM | POA: Insufficient documentation

## 2013-11-11 DIAGNOSIS — I890 Lymphedema, not elsewhere classified: Secondary | ICD-10-CM | POA: Insufficient documentation

## 2013-11-26 ENCOUNTER — Ambulatory Visit (INDEPENDENT_AMBULATORY_CARE_PROVIDER_SITE_OTHER): Payer: No Typology Code available for payment source | Admitting: Internal Medicine

## 2013-11-26 ENCOUNTER — Encounter: Payer: Self-pay | Admitting: Internal Medicine

## 2013-11-26 DIAGNOSIS — L97909 Non-pressure chronic ulcer of unspecified part of unspecified lower leg with unspecified severity: Secondary | ICD-10-CM

## 2013-11-26 DIAGNOSIS — I83019 Varicose veins of right lower extremity with ulcer of unspecified site: Secondary | ICD-10-CM

## 2013-11-26 DIAGNOSIS — L97929 Non-pressure chronic ulcer of unspecified part of left lower leg with unspecified severity: Secondary | ICD-10-CM

## 2013-11-26 DIAGNOSIS — G4733 Obstructive sleep apnea (adult) (pediatric): Secondary | ICD-10-CM

## 2013-11-26 DIAGNOSIS — I10 Essential (primary) hypertension: Secondary | ICD-10-CM

## 2013-11-26 DIAGNOSIS — R609 Edema, unspecified: Secondary | ICD-10-CM | POA: Insufficient documentation

## 2013-11-26 DIAGNOSIS — I83893 Varicose veins of bilateral lower extremities with other complications: Secondary | ICD-10-CM

## 2013-11-26 DIAGNOSIS — E662 Morbid (severe) obesity with alveolar hypoventilation: Secondary | ICD-10-CM

## 2013-11-26 DIAGNOSIS — G4730 Sleep apnea, unspecified: Secondary | ICD-10-CM

## 2013-11-26 DIAGNOSIS — R0609 Other forms of dyspnea: Secondary | ICD-10-CM

## 2013-11-26 DIAGNOSIS — I872 Venous insufficiency (chronic) (peripheral): Secondary | ICD-10-CM

## 2013-11-26 DIAGNOSIS — I83029 Varicose veins of left lower extremity with ulcer of unspecified site: Secondary | ICD-10-CM

## 2013-11-26 DIAGNOSIS — L97919 Non-pressure chronic ulcer of unspecified part of right lower leg with unspecified severity: Secondary | ICD-10-CM

## 2013-11-26 DIAGNOSIS — R0989 Other specified symptoms and signs involving the circulatory and respiratory systems: Secondary | ICD-10-CM

## 2013-11-26 NOTE — Patient Instructions (Addendum)
Your physician has requested that you have an echocardiogram. Echocardiography is a painless test that uses sound waves to create images of your heart. It provides your doctor with information about the size and shape of your heart and how well your heart's chambers and valves are working. This procedure takes approximately one hour. There are no restrictions for this procedure.  You have been referred to Dr. Craige CottaSood (pulmonologist)  Your physician recommends that you schedule a follow-up appointment after you echocardiogram.

## 2013-11-26 NOTE — Progress Notes (Signed)
OFFICE NOTE  Chief Complaint:  Shortness of breath, fatigue  Primary Care Physician: Tony Lingerhomas Jones, MD  HPI:  Tony Mccullough is a pleasant 33 year old male who unfortunately is morbidly obese, almost super morbidly obese with a weight of 433 pounds and BMI of 57. His past medical history significant for hypoxic respiratory failure in 2010.  He presented with pulmonary edema (probably not congestive heart failure), hypoxic respiratory failure and ultimately required a tracheostomy. Eventually this was de-cannulated and he is no longer requiring that.  He previously saw Dr. Craige Mccullough, with a power pulmonary. For while he was on CPAP and he said it didn't work too well. He does have a history of obstructive sleep apnea and I wonder if he has obesity hypoventilation syndrome. It's very reasonable to think that he has upper airway obstructive syndrome given his weight, and shortness of breath. He does report significant fatigue and nonrestorative sleep as well as snoring. He has reported some shortness of breath with exertion as well as lower extremity swelling. He has chronic venous disease as well as a history of venous ulcers and has been cared for at the wound care center. He was also diagnosed with lymphedema. Much of this suggests high right heart pressures. I do not see evidence of a prior heart catheterization, specifically right heart catheterization or echocardiogram in the chart. Fortunately he denies any chest pain.  PMHx:  Past Medical History  Diagnosis Date  . Hypertension   . Respiratory failure 09/02/2008    s/p pneumonia, on ventilator and treacheotomy  . Wears glasses   . Venous stasis dermatitis   . Morbid obesity   . OSA (obstructive sleep apnea)   . CHF (congestive heart failure) 09/02/2008  . Elephantiasis   . Lymphedema of lower extremity     chronic, legs  . Enlarged heart     Past Surgical History  Procedure Laterality Date  . Tracheostomy      FAMHx:  Family  History  Problem Relation Age of Onset  . Hypertension    . Heart failure    . Diabetes Mother   . Heart disease Father   . Hyperlipidemia Father   . Cancer Neg Hx   . Alcohol abuse Neg Hx   . COPD Neg Hx   . Depression Neg Hx   . Drug abuse Neg Hx   . Early death Neg Hx   . Hearing loss Neg Hx   . Learning disabilities Neg Hx   . Stroke Neg Hx     SOCHx:   reports that he has never smoked. He does not have any smokeless tobacco history on file. He reports that he does not drink alcohol or use illicit drugs.  ALLERGIES:  No Known Allergies  ROS: A comprehensive review of systems was negative except for: Constitutional: positive for fatigue Respiratory: positive for dyspnea on exertion Cardiovascular: positive for lower extremity edema  HOME MEDS: Current Outpatient Prescriptions  Medication Sig Dispense Refill  . furosemide (LASIX) 40 MG tablet Take 40 mg by mouth daily.      . naproxen sodium (ANAPROX) 220 MG tablet Take 220 mg by mouth 2 (two) times daily as needed.      . nebivolol (BYSTOLIC) 5 MG tablet Take 1 tablet (5 mg total) by mouth daily.  70 tablet  0   No current facility-administered medications for this visit.    LABS/IMAGING: No results found for this or any previous visit (from the past 48 hour(s)). No  results found.  VITALS: BP 152/84  Pulse 99  Ht 6\' 1"  (1.854 m)  Wt 433 lb 6.4 oz (196.589 kg)  BMI 57.19 kg/m2  EXAM: General appearance: alert and no distress Neck: no carotid bruit and neck thick, JVP could not be assessed Lungs: diminished breath sounds bilaterally Heart: regular, distant heart sounds Abdomen: morbidly obese, soft, organs could not be palpated Extremities: edema 1+, varicose veins noted and venous stasis dermatitis noted Pulses: 2+ and symmetric Skin: abnormal LE discoloration Neurologic: Mental status: Alert, oriented, thought content appropriate Psych: Normal, pleasant  EKG: NSR at 99, right superior axis  deviation  ASSESSMENT: 1. Near super morbid obesity 2. Probable obesity hypoventilation syndrome 3. Dyspnea on exertion 4. Untreated obstructive sleep apnea, previously requiring tracheostomy  PLAN: 1.   Tony Mccullough has significant morbid obesity which is the main underlying problem. He has never been able lose more weight and then to get below 300 pounds. 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 I think the main issue should be focusing on life-saving ways to lower his weight. The most effective would be bariatric surgery. He reports that his insurance may not cover that which could be an issue however I advised a referral for evaluation. With regards to shortness of breath I would recommend an echocardiogram as I cannot find evidence of that going back to 2010. He be helpful to know what his right heart function is and pulmonary pressures are if we can obtain those, which may be limiting due to his body habitus. The other important treatment for him now would be to reestablish him with his pulmonologist. He should be reevaluated for possible obesity hypoventilation syndrome and maybe candidate for BiPAP, or at minimum CPAP.  I will go ahead and refer him back to see Dr. Craige Cotta.  We will contact him with the results of his echocardiogram and make further recommendations at that time. Thanks again for the kind referral.  Chrystie Nose, MD, [facility] Attending Cardiologist [facility]  Tony Mccullough 11/26/2013, 1:22 PM

## 2013-12-03 ENCOUNTER — Ambulatory Visit (HOSPITAL_COMMUNITY)
Admission: RE | Admit: 2013-12-03 | Discharge: 2013-12-03 | Disposition: A | Discharge status: Home / Self Care | Payer: No Typology Code available for payment source | Source: Ambulatory Visit | Attending: Internal Medicine | Admitting: Internal Medicine

## 2013-12-03 DIAGNOSIS — I10 Essential (primary) hypertension: Secondary | ICD-10-CM | POA: Insufficient documentation

## 2013-12-03 DIAGNOSIS — R0989 Other specified symptoms and signs involving the circulatory and respiratory systems: Principal | ICD-10-CM | POA: Insufficient documentation

## 2013-12-03 DIAGNOSIS — E662 Morbid (severe) obesity with alveolar hypoventilation: Secondary | ICD-10-CM | POA: Insufficient documentation

## 2013-12-03 DIAGNOSIS — I509 Heart failure, unspecified: Secondary | ICD-10-CM | POA: Insufficient documentation

## 2013-12-03 DIAGNOSIS — R0609 Other forms of dyspnea: Secondary | ICD-10-CM | POA: Insufficient documentation

## 2013-12-03 DIAGNOSIS — I517 Cardiomegaly: Secondary | ICD-10-CM

## 2013-12-03 NOTE — Progress Notes (Signed)
Echo Lab  2D Echocardiogram completed.  Sharna Gabrys L Fronnie Urton, RDCS 12/03/2013 10:37 AM   

## 2013-12-09 ENCOUNTER — Encounter: Payer: Self-pay | Admitting: Internal Medicine

## 2013-12-09 ENCOUNTER — Ambulatory Visit (INDEPENDENT_AMBULATORY_CARE_PROVIDER_SITE_OTHER): Payer: No Typology Code available for payment source | Admitting: Internal Medicine

## 2013-12-09 VITALS — BP 142/80 | HR 87 | Temp 98.2°F | Resp 16 | Ht 73.0 in | Wt >= 6400 oz

## 2013-12-09 DIAGNOSIS — I83893 Varicose veins of bilateral lower extremities with other complications: Secondary | ICD-10-CM

## 2013-12-09 DIAGNOSIS — I10 Essential (primary) hypertension: Secondary | ICD-10-CM

## 2013-12-09 DIAGNOSIS — I5022 Chronic systolic (congestive) heart failure: Secondary | ICD-10-CM

## 2013-12-09 MED ORDER — NEBIVOLOL HCL 5 MG PO TABS: 5.0000 mg | ORAL_TABLET | Freq: Every day | ORAL | Status: DC

## 2013-12-09 MED ORDER — FUROSEMIDE 40 MG PO TABS: 40.0000 mg | ORAL_TABLET | Freq: Every day | ORAL | Status: DC

## 2013-12-09 NOTE — Assessment & Plan Note (Signed)
Improvement noted He will cont to f/up with cardiology and pulm

## 2013-12-09 NOTE — Progress Notes (Signed)
Subjective:    Patient ID: Tony Mccullough, male    DOB: 10/02/1980, 33 y.o.   MRN: [id number]  Hypertension This is a chronic problem. The current episode started more than 1 year ago. The problem has been gradually improving since onset. The problem is controlled. Associated symptoms include malaise/fatigue, peripheral edema and shortness of breath. Pertinent negatives include no anxiety, blurred vision, chest pain, headaches, neck pain, orthopnea or palpitations. Agents associated with hypertension include NSAIDs. Past treatments include diuretics and beta blockers. Compliance problems include diet and exercise.  Hypertensive end-organ damage includes heart failure and left ventricular hypertrophy. Identifiable causes of hypertension include sleep apnea.      Review of Systems  Constitutional: Positive for malaise/fatigue. Negative for fever, chills, diaphoresis, appetite change and fatigue.  HENT: Negative.   Eyes: Negative.  Negative for blurred vision.  Respiratory: Positive for apnea and shortness of breath. Negative for cough, choking, chest tightness, wheezing and stridor.   Cardiovascular: Negative.  Negative for chest pain, palpitations, orthopnea and leg swelling.  Gastrointestinal: Negative.  Negative for nausea, abdominal pain, diarrhea, constipation and blood in stool.  Endocrine: Negative.   Genitourinary: Negative.   Musculoskeletal: Negative.  Negative for arthralgias, back pain, gait problem, joint swelling, myalgias, neck pain and neck stiffness.  Skin: Negative.  Negative for rash.  Allergic/Immunologic: Negative.   Neurological: Negative.  Negative for dizziness, tremors, syncope, facial asymmetry, speech difficulty, weakness, light-headedness, numbness and headaches.  Hematological: Negative.  Negative for adenopathy. Does not bruise/bleed easily.  Psychiatric/Behavioral: Negative.        Objective:   Physical Exam  Vitals reviewed. Constitutional: He is oriented  to person, place, and time. He appears well-developed and well-nourished. No distress.  HENT:  Head: Normocephalic and atraumatic.  Mouth/Throat: Oropharynx is clear and moist. No oropharyngeal exudate.  Eyes: Conjunctivae are normal. Right eye exhibits no discharge. Left eye exhibits no discharge. No scleral icterus.  Neck: Normal range of motion. Neck supple. No JVD present. No tracheal deviation present. No thyromegaly present.  Cardiovascular: Normal rate, regular rhythm and intact distal pulses.  Exam reveals no gallop and no friction rub.   No murmur heard. Pulmonary/Chest: Effort normal and breath sounds normal. No stridor. No respiratory distress. He has no wheezes. He has no rales. He exhibits no tenderness.  Abdominal: Soft. Bowel sounds are normal. He exhibits no distension and no mass. There is no tenderness. There is no rebound and no guarding.  Musculoskeletal: Normal range of motion. He exhibits edema. He exhibits no tenderness.  Lymphadenopathy:    He has no cervical adenopathy.  Neurological: He is oriented to person, place, and time.  Skin: Skin is warm and dry. No rash noted. He is not diaphoretic. No erythema. No pallor.  Psychiatric: He has a normal mood and affect. His behavior is normal. Judgment and thought content normal.     Lab Results  Component Value Date   WBC 8.5 10/08/2013   HGB 15.8 10/08/2013   HCT 46.8 10/08/2013   PLT 207.0 10/08/2013   GLUCOSE 99 10/08/2013   CHOL 115 10/08/2013   TRIG 98.0 10/08/2013   HDL 28.50* 10/08/2013   LDLCALC 67 10/08/2013   ALT 29 10/08/2013   AST 29 10/08/2013   NA 137 10/08/2013   K 3.9 10/08/2013   CL 96 10/08/2013   CREATININE 0.9 10/08/2013   BUN 12 10/08/2013   CO2 34* 10/08/2013   TSH 2.04 10/08/2013   INR 2.7* 10/18/2008   HGBA1C 6.3 10/08/2013  MICROALBUR 1.49 08/25/2008       Assessment & Plan:

## 2013-12-09 NOTE — Patient Instructions (Signed)

## 2013-12-09 NOTE — Progress Notes (Signed)
Pre visit review using our clinic review tool, if applicable. No additional management support is needed unless otherwise documented below in the visit note. 

## 2013-12-13 ENCOUNTER — Encounter (HOSPITAL_BASED_OUTPATIENT_CLINIC_OR_DEPARTMENT_OTHER): Payer: No Typology Code available for payment source | Attending: General Surgery

## 2013-12-13 DIAGNOSIS — I87339 Chronic venous hypertension (idiopathic) with ulcer and inflammation of unspecified lower extremity: Secondary | ICD-10-CM | POA: Diagnosis not present

## 2013-12-13 DIAGNOSIS — L97809 Non-pressure chronic ulcer of other part of unspecified lower leg with unspecified severity: Secondary | ICD-10-CM | POA: Insufficient documentation

## 2013-12-13 DIAGNOSIS — L97909 Non-pressure chronic ulcer of unspecified part of unspecified lower leg with unspecified severity: Principal | ICD-10-CM

## 2013-12-20 DIAGNOSIS — I87339 Chronic venous hypertension (idiopathic) with ulcer and inflammation of unspecified lower extremity: Secondary | ICD-10-CM | POA: Diagnosis not present

## 2013-12-27 DIAGNOSIS — I87339 Chronic venous hypertension (idiopathic) with ulcer and inflammation of unspecified lower extremity: Secondary | ICD-10-CM | POA: Diagnosis not present

## 2013-12-27 DIAGNOSIS — L97909 Non-pressure chronic ulcer of unspecified part of unspecified lower leg with unspecified severity: Secondary | ICD-10-CM | POA: Diagnosis not present

## 2014-01-01 ENCOUNTER — Ambulatory Visit (INDEPENDENT_AMBULATORY_CARE_PROVIDER_SITE_OTHER): Payer: No Typology Code available for payment source | Admitting: Emergency Medicine

## 2014-01-01 ENCOUNTER — Ambulatory Visit (INDEPENDENT_AMBULATORY_CARE_PROVIDER_SITE_OTHER): Payer: No Typology Code available for payment source

## 2014-01-01 VITALS — HR 97 | Temp 98.1°F | Resp 16 | Ht 71.0 in | Wt >= 6400 oz

## 2014-01-01 DIAGNOSIS — M25579 Pain in unspecified ankle and joints of unspecified foot: Secondary | ICD-10-CM

## 2014-01-01 DIAGNOSIS — M25572 Pain in left ankle and joints of left foot: Secondary | ICD-10-CM

## 2014-01-01 MED ORDER — MELOXICAM 15 MG PO TABS: 15.0000 mg | ORAL_TABLET | Freq: Every day | ORAL | Status: DC

## 2014-01-01 NOTE — Progress Notes (Signed)
   Subjective:    Patient ID: Tony Mccullough, male    DOB: [birth year], 33 y.o.   MRN: [id number]   PCP: Tony Linger, MD  Chief Complaint  Patient presents with  . Leg Injury    left leg x 1 week   Medications, allergies, past medical history, surgical history, family history, social history and problem list reviewed and updated.  Patient Active Problem List   Diagnosis Date Noted  . Obesity hypoventilation syndrome 11/26/2013  . Varicose veins of lower extremities with other complications 11/26/2013  . Routine general medical examination at a health care facility 10/08/2013  . Essential hypertension, benign 10/08/2013  . Venous stasis ulcers of both lower extremities 10/08/2013  . OBSTRUCTIVE SLEEP APNEA 04/08/2010  . Chronic respiratory failure 10/28/2008  . Morbid obesity 10/27/2008    Prior to Admission medications   Medication Sig Start Date End Date Taking? Authorizing Provider  furosemide (LASIX) 40 MG tablet Take 1 tablet (40 mg total) by mouth daily. 12/09/13  Yes Tony Grandchild, MD  nebivolol (BYSTOLIC) 5 MG tablet Take 1 tablet (5 mg total) by mouth daily. 12/09/13  Yes Tony Grandchild, MD    HPI  [name] on Wednesday evening, caught self, but noted pain in the LEFT knee. 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 Knee pain resolved completely. Then he developed LEFT ankle pain, especially around the heel and ankle. Pain is worse with working, improved with rest. Wasn't able to see his PCP this week.    Review of Systems     Objective:   Physical Exam  Constitutional: He is oriented to person, place, and time. He appears well-developed and well-nourished. He is active and cooperative. No distress.  Pulse 97  Temp(Src) 98.1 F (36.7 C) (Oral)  Resp 16  Ht  (1.803 m)  Wt 455 lb (206.387 kg)  BMI 63.49 kg/m2  SpO2 96%   Cardiovascular:  Difficult to palpate pedal pulses due to Unnas boot. Capillary refill <3 seconds.  Pulmonary/Chest: Effort  normal.  Musculoskeletal:       Left ankle: He exhibits normal range of motion. Tenderness. Medial malleolus tenderness found. Achilles tendon normal.       Feet:  Unnas boot in place from mid-foot to knee. Skin is very dry and scaled. Nails are thickened and discolored.   Neurological: He is alert and oriented to person, place, and time.  Skin: Skin is warm and dry.  Psychiatric: He has a normal mood and affect. His behavior is normal.   LEFT ANKLE: [facility] reading (PRIMARY) by  Dr. Cleta Alberts.  Small avulsion noted distal to the medial malleolus.  Unclear if that is new.         Assessment & Plan:  1. Pain in joint, ankle and foot, left Possible avulsion fracture, but could be old.  Due to his size, crutches present a safety concern.  He is encouraged to use a cane to minimize weight bearing. He's to return to work as scheduled on Sunday. If his pain persists, plan referral to ortho. - DG Ankle Complete Left; Future - meloxicam (MOBIC) 15 MG tablet; Take 1 tablet (15 mg total) by mouth daily.  Dispense: 20 tablet; Refill: 0   Fernande Bras, PA-C Physician Assistant-Certified [facility]

## 2014-01-01 NOTE — Patient Instructions (Signed)
Try to stay off your leg.  If you have a cane, that can help reduce the weight on your ankle when you get up to walk. If your pain persists, we will plan to refer you to an orthopedic specialist.

## 2014-01-03 DIAGNOSIS — L97909 Non-pressure chronic ulcer of unspecified part of unspecified lower leg with unspecified severity: Secondary | ICD-10-CM | POA: Diagnosis not present

## 2014-01-03 DIAGNOSIS — I87339 Chronic venous hypertension (idiopathic) with ulcer and inflammation of unspecified lower extremity: Secondary | ICD-10-CM | POA: Diagnosis not present

## 2014-01-07 ENCOUNTER — Ambulatory Visit (INDEPENDENT_AMBULATORY_CARE_PROVIDER_SITE_OTHER): Payer: No Typology Code available for payment source | Admitting: Pulmonary Disease

## 2014-01-07 ENCOUNTER — Encounter: Payer: Self-pay | Admitting: Pulmonary Disease

## 2014-01-07 VITALS — BP 138/76 | HR 94 | Ht 71.0 in | Wt >= 6400 oz

## 2014-01-07 DIAGNOSIS — I83029 Varicose veins of left lower extremity with ulcer of unspecified site: Secondary | ICD-10-CM

## 2014-01-07 DIAGNOSIS — L97919 Non-pressure chronic ulcer of unspecified part of right lower leg with unspecified severity: Secondary | ICD-10-CM

## 2014-01-07 DIAGNOSIS — I872 Venous insufficiency (chronic) (peripheral): Secondary | ICD-10-CM

## 2014-01-07 DIAGNOSIS — I83019 Varicose veins of right lower extremity with ulcer of unspecified site: Secondary | ICD-10-CM

## 2014-01-07 DIAGNOSIS — L97909 Non-pressure chronic ulcer of unspecified part of unspecified lower leg with unspecified severity: Secondary | ICD-10-CM

## 2014-01-07 DIAGNOSIS — L97929 Non-pressure chronic ulcer of unspecified part of left lower leg with unspecified severity: Secondary | ICD-10-CM

## 2014-01-07 DIAGNOSIS — G4733 Obstructive sleep apnea (adult) (pediatric): Secondary | ICD-10-CM

## 2014-01-07 NOTE — Progress Notes (Deleted)
   Subjective:    Patient ID: Tony Mccullough, male    DOB: 1980-10-27, 33 y.o.   MRN: [id number]  HPI    Review of Systems  Constitutional: Negative for fever and unexpected weight change.  HENT: Negative for congestion, dental problem, ear pain, nosebleeds, postnasal drip, rhinorrhea, sinus pressure, sneezing, sore throat and trouble swallowing.   Eyes: Negative for redness and itching.  Respiratory: Positive for cough and shortness of breath. Negative for chest tightness and wheezing.   Cardiovascular: Negative for palpitations and leg swelling.  Gastrointestinal: Negative for nausea and vomiting.  Genitourinary: Negative for dysuria.  Musculoskeletal: Negative for joint swelling.  Skin: Negative for rash.  Neurological: Positive for headaches.  Hematological: Does not bruise/bleed easily.  Psychiatric/Behavioral: Negative for dysphoric mood. The patient is not nervous/anxious.        Objective:   Physical Exam        Assessment & Plan:

## 2014-01-07 NOTE — Assessment & Plan Note (Signed)
He has prior history of sleep apnea.  He has elevated HCO3 on recent lab work.  He has morbid obesity, and history of hypertension.  He continues to have snoring, sleep disruption, and difficulty maintaining wakefulness.  He likely has sleep apnea still, but also could have hypoventilation.  We discussed how sleep apnea can affect various health problems including risks for hypertension, cardiovascular disease, and diabetes.  We also discussed how sleep disruption can increase risks for accident, such as while driving.  Weight loss as a means of improving sleep apnea was also reviewed.  Additional treatment options discussed were CPAP therapy, oral appliance, and surgical intervention.  To further assess will arrange for in lab sleep study.  Will then determine if he needs CPAP vs BiPAP +/- supplemental oxygen.

## 2014-01-07 NOTE — Assessment & Plan Note (Signed)
He is to f/u with PCP and wound care center.

## 2014-01-07 NOTE — Patient Instructions (Signed)
Will arrange for sleep study Will call to arrange for follow up after sleep study reviewed 

## 2014-01-07 NOTE — Progress Notes (Signed)
Chief Complaint  Patient presents with  . SLEEP CONSULT    Referred by Dr Rennis Golden. Sleep study 2011. Epworth Score: 14    History of Present Illness: Tony Mccullough is a 33 y.o. male with OSA.  He previously had tracheostomy, and this was removed several years ago.    He had CPAP titration done, and was to be set up for CPAP.  He never actually had this done.  He was seen recently by cardiology, and was advised he should have further assessment for his sleep apnea.  He works 3rd shift.  He goes to bed at 9 am.  He falls asleep quickly.  He wakes up a few times to use the bathroom.  He gets up at 4 pm.  He feels tired in the morning.  He is not using anything to help him sleep or stay awake.  He does snore, and will stop breathing while sleep.  The patient denies sleep walking, sleep talking, bruxism, or nightmares.  There is no history of restless legs.  The patient denies sleep hallucinations, sleep paralysis, or cataplexy.  He has hx of lymphedema and chronic cellulitis.  His Epworth score is 14 out of 24.  TESTS: 2011 >> Tracheostomy decannulation Echo 12/03/13 >> mild LVH, EF 60 to 65%, mild RA dilation   He  has a past medical history of Hypertension; Respiratory failure (09/02/2008); Wears glasses; Venous stasis dermatitis; Morbid obesity; OSA (obstructive sleep apnea); CHF (congestive heart failure) (09/02/2008); Elephantiasis; Lymphedema of lower extremity; and Enlarged heart.  He  has past surgical history that includes Tracheostomy.  His family history includes Diabetes in his mother; Heart disease in his father; Heart failure in an other family member; Hyperlipidemia in his father; Hypertension in an other family member. There is no history of Cancer, Alcohol abuse, COPD, Depression, Drug abuse, Early death, Hearing loss, Learning disabilities, or Stroke.  He  reports that he has never smoked. He has never used smokeless tobacco. He reports that he drinks alcohol. He reports  that he does not use illicit drugs.  No Known Allergies     Medication List       This list is accurate as of: 01/07/14 10:24 AM.  Always use your most recent med list.               furosemide 40 MG tablet  Commonly known as:  LASIX  Take 1 tablet (40 mg total) by mouth daily.     meloxicam 15 MG tablet  Commonly known as:  MOBIC  Take 1 tablet (15 mg total) by mouth daily.     nebivolol 5 MG tablet  Commonly known as:  BYSTOLIC  Take 1 tablet (5 mg total) by mouth daily.        Review of Systems  Constitutional: Negative for fever and unexpected weight change.  HENT: Negative for congestion, dental problem, ear pain, nosebleeds, postnasal drip, rhinorrhea, sinus pressure, sneezing, sore throat and trouble swallowing.   Eyes: Negative for redness and itching.  Respiratory: Positive for cough and shortness of breath. Negative for chest tightness and wheezing.   Cardiovascular: Negative for palpitations and leg swelling.  Gastrointestinal: Negative for nausea and vomiting.  Genitourinary: Negative for dysuria.  Musculoskeletal: Negative for joint swelling.  Skin: Negative for rash.  Neurological: Positive for headaches.  Hematological: Does not bruise/bleed easily.  Psychiatric/Behavioral: Negative for dysphoric mood. The patient is not nervous/anxious.    Physical Exam:  General - No distress, morbid obesity, foul  smell ENT - No sinus tenderness, no oral exudate, no LAN, MP 4, tracheostomy stoma scare well healed, wears glasses Cardiac - s1s2 regular, no murmur Chest - No wheeze/rales/dullness Back - No focal tenderness Abd - Soft, non-tender Ext - 3+ lymphedema, with b/l lower extremities in wrap Neuro - Normal strength Skin - No rashes Psych - normal mood, and behavior  Lab Results  Component Value Date   CREATININE 0.9 10/08/2013   BUN 12 10/08/2013   NA 137 10/08/2013   K 3.9 10/08/2013   CL 96 10/08/2013   CO2 34* 10/08/2013    Lab Results  Component Value  Date   WBC 8.5 10/08/2013   HGB 15.8 10/08/2013   HCT 46.8 10/08/2013   MCV 92.9 10/08/2013   PLT 207.0 10/08/2013    Lab Results  Component Value Date   TSH 2.04 10/08/2013     Assessment/Plan:  Coralyn Helling, MD [facility] Pager:  [phone]

## 2014-01-09 ENCOUNTER — Encounter: Payer: Self-pay | Admitting: Internal Medicine

## 2014-01-09 ENCOUNTER — Ambulatory Visit (INDEPENDENT_AMBULATORY_CARE_PROVIDER_SITE_OTHER): Payer: No Typology Code available for payment source | Admitting: Internal Medicine

## 2014-01-09 VITALS — BP 134/82 | HR 108 | Ht 71.0 in | Wt >= 6400 oz

## 2014-01-09 DIAGNOSIS — G4733 Obstructive sleep apnea (adult) (pediatric): Secondary | ICD-10-CM

## 2014-01-09 DIAGNOSIS — I10 Essential (primary) hypertension: Secondary | ICD-10-CM

## 2014-01-09 DIAGNOSIS — E662 Morbid (severe) obesity with alveolar hypoventilation: Secondary | ICD-10-CM

## 2014-01-09 NOTE — Patient Instructions (Signed)
Dr Rennis Golden has recommended you follow-up with him as needed.

## 2014-01-09 NOTE — Progress Notes (Signed)
OFFICE NOTE  Chief Complaint:  Shortness of breath, fatigue  Primary Care Physician: Sanda Linger, MD  HPI:  Tony Mccullough is a pleasant 33 year old male who unfortunately is morbidly obese, almost super morbidly obese with a weight of 433 pounds and BMI of 57. His past medical history significant for hypoxic respiratory failure in 2010.  He presented with pulmonary edema (probably not congestive heart failure), hypoxic respiratory failure and ultimately required a tracheostomy. Eventually this was de-cannulated and he is no longer requiring that.  He previously saw Dr. Craige Cotta, with a power pulmonary. For while he was on CPAP and he said it didn't work too well. He does have a history of obstructive sleep apnea and I wonder if he has obesity hypoventilation syndrome. It's very reasonable to think that he has upper airway obstructive syndrome given his weight, and shortness of breath. He does report significant fatigue and nonrestorative sleep as well as snoring. He has reported some shortness of breath with exertion as well as lower extremity swelling. He has chronic venous disease as well as a history of venous ulcers and has been cared for at the wound care center. He was also diagnosed with lymphedema. Much of this suggests high right heart pressures. I do not see evidence of a prior heart catheterization, specifically right heart catheterization or echocardiogram in the chart. Fortunately he denies any chest pain.  Mr. Ruggieri returns today for followup. His echo shows essentially no change. EF is 60-65% with no signs of right heart failure. I think his main problem is related to his severe morbid obesity. He did recently see Dr. Craige Cotta, who feels he likely has either obstructive sleep apnea with the requirement of CPAP or BiPAP. He is scheduled for a sleep study in October. He needs to continue to work on weight loss and reestablish insurance as he would possibly be candidate for bariatric  surgery.  PMHx:  Past Medical History  Diagnosis Date  . Hypertension   . Respiratory failure 09/02/2008    s/p pneumonia, on ventilator and treacheotomy  . Wears glasses   . Venous stasis dermatitis   . Morbid obesity   . OSA (obstructive sleep apnea)   . CHF (congestive heart failure) 09/02/2008  . Elephantiasis   . Lymphedema of lower extremity     chronic, legs  . Enlarged heart     Past Surgical History  Procedure Laterality Date  . Tracheostomy      FAMHx:  Family History  Problem Relation Age of Onset  . Hypertension    . Heart failure    . Diabetes Mother   . Heart disease Father   . Hyperlipidemia Father   . Cancer Neg Hx   . Alcohol abuse Neg Hx   . COPD Neg Hx   . Depression Neg Hx   . Drug abuse Neg Hx   . Early death Neg Hx   . Hearing loss Neg Hx   . Learning disabilities Neg Hx   . Stroke Neg Hx     SOCHx:   reports that he has never smoked. He has never used smokeless tobacco. He reports that he drinks alcohol. He reports that he does not use illicit drugs.  ALLERGIES:  No Known Allergies  ROS: A comprehensive review of systems was negative except for: Constitutional: positive for fatigue Respiratory: positive for dyspnea on exertion Cardiovascular: positive for lower extremity edema  HOME MEDS: Current Outpatient Prescriptions  Medication Sig Dispense Refill  . furosemide (  LASIX) 40 MG tablet Take 1 tablet (40 mg total) by mouth daily.  30 tablet  5  . meloxicam (MOBIC) 15 MG tablet Take 1 tablet (15 mg total) by mouth daily.  20 tablet  0  . nebivolol (BYSTOLIC) 5 MG tablet Take 1 tablet (5 mg total) by mouth daily.  30 tablet  5   No current facility-administered medications for this visit.    LABS/IMAGING: No results found for this or any previous visit (from the past 48 hour(s)). No results found.  VITALS: BP 134/82  Pulse 108  Ht  (1.803 m)  Wt 447 lb 1.6 oz (202.803 kg)  BMI 62.39  kg/m2  EXAM: deferred  EKG: deferred  ASSESSMENT: 1. Near super morbid obesity 2. Probable obesity hypoventilation syndrome 3. Dyspnea on exertion - EF 60-65%, no evidence for right heart failure 4. Untreated obstructive sleep apnea, previously requiring tracheostomy  PLAN: 1.   Mr. Geralds had an echocardiogram which is somewhat reassuring and shows normal systolic function. Pulmonary pressures could not be estimated due to his poor echo windows. There is no sign for right heart failure. I believe that reestablishing treatment for sleep apnea is imperative as well as exercise and continued work on weight loss. He should again investigate weight-loss surgery if he can afford it and try to get better insurance.  I'm happy to see him back as needed.  Chrystie Nose, MD, [facility] Attending Cardiologist [facility]  HILTY,Kenneth C 01/09/2014, 10:26 AM

## 2014-01-10 ENCOUNTER — Encounter (HOSPITAL_BASED_OUTPATIENT_CLINIC_OR_DEPARTMENT_OTHER): Payer: No Typology Code available for payment source | Attending: General Surgery

## 2014-01-10 DIAGNOSIS — L97809 Non-pressure chronic ulcer of other part of unspecified lower leg with unspecified severity: Secondary | ICD-10-CM | POA: Diagnosis not present

## 2014-01-10 DIAGNOSIS — I872 Venous insufficiency (chronic) (peripheral): Secondary | ICD-10-CM | POA: Diagnosis not present

## 2014-01-17 DIAGNOSIS — I872 Venous insufficiency (chronic) (peripheral): Secondary | ICD-10-CM | POA: Diagnosis not present

## 2014-01-24 DIAGNOSIS — I872 Venous insufficiency (chronic) (peripheral): Secondary | ICD-10-CM | POA: Diagnosis not present

## 2014-01-31 DIAGNOSIS — I872 Venous insufficiency (chronic) (peripheral): Secondary | ICD-10-CM | POA: Diagnosis not present

## 2014-02-07 ENCOUNTER — Encounter (HOSPITAL_BASED_OUTPATIENT_CLINIC_OR_DEPARTMENT_OTHER): Payer: No Typology Code available for payment source | Attending: General Surgery

## 2014-02-07 DIAGNOSIS — L97929 Non-pressure chronic ulcer of unspecified part of left lower leg with unspecified severity: Secondary | ICD-10-CM | POA: Diagnosis not present

## 2014-02-07 DIAGNOSIS — L97919 Non-pressure chronic ulcer of unspecified part of right lower leg with unspecified severity: Secondary | ICD-10-CM | POA: Insufficient documentation

## 2014-02-07 DIAGNOSIS — I87313 Chronic venous hypertension (idiopathic) with ulcer of bilateral lower extremity: Secondary | ICD-10-CM | POA: Diagnosis present

## 2014-02-11 DIAGNOSIS — I87313 Chronic venous hypertension (idiopathic) with ulcer of bilateral lower extremity: Secondary | ICD-10-CM | POA: Diagnosis not present

## 2014-02-18 DIAGNOSIS — I87313 Chronic venous hypertension (idiopathic) with ulcer of bilateral lower extremity: Secondary | ICD-10-CM | POA: Diagnosis not present

## 2014-02-21 DIAGNOSIS — I87313 Chronic venous hypertension (idiopathic) with ulcer of bilateral lower extremity: Secondary | ICD-10-CM | POA: Diagnosis not present

## 2014-02-28 DIAGNOSIS — I87313 Chronic venous hypertension (idiopathic) with ulcer of bilateral lower extremity: Secondary | ICD-10-CM | POA: Diagnosis not present

## 2014-03-02 ENCOUNTER — Ambulatory Visit (HOSPITAL_BASED_OUTPATIENT_CLINIC_OR_DEPARTMENT_OTHER): Payer: No Typology Code available for payment source | Attending: Pulmonary Disease | Admitting: Radiology

## 2014-03-02 VITALS — Ht 71.0 in | Wt >= 6400 oz

## 2014-03-02 DIAGNOSIS — G4733 Obstructive sleep apnea (adult) (pediatric): Secondary | ICD-10-CM | POA: Diagnosis not present

## 2014-03-07 DIAGNOSIS — I87313 Chronic venous hypertension (idiopathic) with ulcer of bilateral lower extremity: Secondary | ICD-10-CM | POA: Diagnosis not present

## 2014-03-14 ENCOUNTER — Encounter (HOSPITAL_BASED_OUTPATIENT_CLINIC_OR_DEPARTMENT_OTHER): Payer: No Typology Code available for payment source | Attending: General Surgery

## 2014-03-14 ENCOUNTER — Telehealth: Payer: Self-pay | Admitting: Pulmonary Disease

## 2014-03-14 DIAGNOSIS — L97811 Non-pressure chronic ulcer of other part of right lower leg limited to breakdown of skin: Secondary | ICD-10-CM | POA: Diagnosis not present

## 2014-03-14 DIAGNOSIS — L97821 Non-pressure chronic ulcer of other part of left lower leg limited to breakdown of skin: Secondary | ICD-10-CM | POA: Diagnosis not present

## 2014-03-14 DIAGNOSIS — I87333 Chronic venous hypertension (idiopathic) with ulcer and inflammation of bilateral lower extremity: Secondary | ICD-10-CM | POA: Diagnosis not present

## 2014-03-14 DIAGNOSIS — G4733 Obstructive sleep apnea (adult) (pediatric): Secondary | ICD-10-CM

## 2014-03-14 DIAGNOSIS — E662 Morbid (severe) obesity with alveolar hypoventilation: Secondary | ICD-10-CM

## 2014-03-14 NOTE — Sleep Study (Signed)
[facility]  NAME: Tony GuestWilliam D Robinson DATE OF BIRTH:  04/21/1981 MEDICAL RECORD NUMBER [id number]  LOCATION: [facility]  PHYSICIAN: Coralyn HellingVineet Jie Stickels, M.D. DATE OF STUDY: 03/02/2014  SLEEP STUDY TYPE: Split night protocol               REFERRING PHYSICIAN: Coralyn HellingSood, Chinedu Agustin, MD  INDICATION FOR STUDY:  Tony Mccullough is a 33 y.o. male who presents to the sleep lab for evaluation of hypersomnia with obstructive sleep apnea.  He reports snoring, sleep disruption, apnea, and daytime sleepiness.  EPWORTH SLEEPINESS SCORE: 13. HEIGHT: 5\' 11"  (180.3 cm)  WEIGHT: 449 lb (203.665 kg)    Body mass index is 62.65 kg/(m^2).  NECK SIZE: 22 in.  MEDICATIONS:  Current Outpatient Prescriptions on File Prior to Visit  Medication Sig Dispense Refill  . furosemide (LASIX) 40 MG tablet Take 1 tablet (40 mg total) by mouth daily. 30 tablet 5  . meloxicam (MOBIC) 15 MG tablet Take 1 tablet (15 mg total) by mouth daily. 20 tablet 0  . nebivolol (BYSTOLIC) 5 MG tablet Take 1 tablet (5 mg total) by mouth daily. 30 tablet 5   No current facility-administered medications on file prior to visit.    SLEEP ARCHITECTURE:  Diagnostic portion: Total recording time: 147 minutes.  Total sleep time was: 120 minutes.  Sleep efficiency: 81.6%.  Sleep latency: 5.5 minutes.  REM latency: 0 minutes.  Stage N1: 4.6%.  Stage N2: 40.8%.  Stage N3: 0%.  Stage R:  54.6%.  Supine sleep: 12 minutes.  Non-supine sleep: 108 minutes.  Titration portion: Total recording time: 263.5 minutes.  Total sleep time was: 188.5 minutes.  Sleep efficiency: 71.5%.  Sleep latency: 0 minutes.  REM latency: 69 minutes.  Stage N1: 7.2%.  Stage N2: 70.8%.  Stage N3: 2.4%.  Stage R:  19.6%.  Supine sleep: 137 minutes.  Non-supine sleep: 100 minutes.  CARDIAC DATA:  Average heart rate: 92 beats per minute. Rhythm strip: sinus rhythm with sinus tachycardia and PVCs.  RESPIRATORY DATA: Average respiratory rate:  18. Snoring: loud.  Diagnostic portion: Average AHI: 107.5.   Apnea index: 100.5.  Hypopnea index: 7. Obstructive apnea index: 99.  Central apnea index: 0.5.  Mixed apnea index: 1. REM AHI: 107.2.  NREM AHI: 107.9. Supine AHI: 85. Non-supine AHI: 110.  Titration portion: He was started on CPAP 6 and increased to 20 cm H2O.  He had control of his obstructive events with CPAP 18 cm H2O >> his AHI was 4.5.  He continue to have oxygen desaturation in spite of being on adequate CPAP setting to control OSA for more than 5 minutes.  He was eventually transitioned to BiPAP from 21/18 to 22/19 cm H2O.  With BiPAP at 21/18 cm H2O his AHI was 0.  MOVEMENT/PARASOMNIA:  Diagnostic portion: Periodic limb movement: 0.  Period limb movements with arousals: 0.  Titration portion: Periodic limb movement: 0.  Period limb movements with arousals: 0.  Restroom trips: two.  OXYGEN DATA:  Baseline oxygenation: 88%. Lowest SaO2: 60%. Time spent below SaO2 88%: 148.4 minutes. Supplemental oxygen used: 3 liters.  He had good control of sleep apnea with CPAP 18 cm H2O.  He continued to have oxygen desaturation in spite of being on adequate CPAP for more than 5 minutes.  He was then started on supplemental oxygen up to 3 liters.  He continued to have oxygen desaturation.  He was then transitioned to BiPAP.  With combination of BiPAP  21/18 cm H2O and 3 liters oxygen he was able to achieve adequate control of his oxygenation.  IMPRESSION/ RECOMMENDATION:   This study shows severe obstructive sleep apnea with an AHI of 107.5 and SaO2 low of 60%.  While his OSA was controlled with CPAP, he continued to have oxygen desaturation in the absence of other respiratory events.  This is consistent with sleep related hypoxia/hypoventilation.  He was ultimately titrated to BiPAP 21/18 cm H2O with 3 liters oxygen.  With this combination he had control of his OSA and hypoxia.  He was fitted with a ResMed Airfit F10 medium  sized full face mask.  Coralyn HellingVineet Talmo Carmack, M.D. Diplomate, Biomedical engineerAmerican Board of Sleep Medicine  ELECTRONICALLY SIGNED ON:  03/14/2014, 4:55 PM [facility] PH: [phone]   FX: [phone] ACCREDITED BY THE AMERICAN ACADEMY OF SLEEP MEDICINE

## 2014-03-14 NOTE — Telephone Encounter (Signed)
Split 03/02/14 >> AHI 107.5, SaO2 low 60%.  BiPAP 21/18 cm H2O with 3 liters oxygen >> AHI 0.   Left message on pt's voicemail explaining that he has severe sleep apnea and hypoventilation.  I will send order to arrange for BiPAP 21/18 cm H2O with 3 liters oxygen.  Will have my nurse call to confirm receipt of message, and schedule him for ROV 2 months after starting BiPAP and nocturnal oxygen.

## 2014-03-18 NOTE — Telephone Encounter (Signed)
LMTCB x 1 

## 2014-03-19 NOTE — Telephone Encounter (Signed)
APS, Iris, is calling.  She has been trying to get in touch with patient.  She has left several message, patient has not returned calls.

## 2014-03-19 NOTE — Telephone Encounter (Signed)
LMTCB-will forward to Ashtyn's box to follow up on.

## 2014-03-21 DIAGNOSIS — I87333 Chronic venous hypertension (idiopathic) with ulcer and inflammation of bilateral lower extremity: Secondary | ICD-10-CM | POA: Diagnosis not present

## 2014-03-21 NOTE — Telephone Encounter (Signed)
LM x 2

## 2014-03-26 NOTE — Telephone Encounter (Signed)
Called and LM with pt.

## 2014-03-28 DIAGNOSIS — I87333 Chronic venous hypertension (idiopathic) with ulcer and inflammation of bilateral lower extremity: Secondary | ICD-10-CM | POA: Diagnosis not present

## 2014-04-02 DIAGNOSIS — I87333 Chronic venous hypertension (idiopathic) with ulcer and inflammation of bilateral lower extremity: Secondary | ICD-10-CM | POA: Diagnosis not present

## 2014-04-04 NOTE — Telephone Encounter (Signed)
LM x 4 Letter mailed to pt to contact our office.  msg to be closed.

## 2014-04-11 ENCOUNTER — Encounter (HOSPITAL_BASED_OUTPATIENT_CLINIC_OR_DEPARTMENT_OTHER): Payer: No Typology Code available for payment source | Attending: Internal Medicine

## 2014-04-11 DIAGNOSIS — I87302 Chronic venous hypertension (idiopathic) without complications of left lower extremity: Secondary | ICD-10-CM | POA: Insufficient documentation

## 2014-04-11 DIAGNOSIS — L97829 Non-pressure chronic ulcer of other part of left lower leg with unspecified severity: Secondary | ICD-10-CM | POA: Insufficient documentation

## 2014-04-14 ENCOUNTER — Encounter (HOSPITAL_COMMUNITY): Payer: Self-pay

## 2014-04-14 ENCOUNTER — Emergency Department (HOSPITAL_COMMUNITY)
Admission: EM | Admit: 2014-04-14 | Discharge: 2014-04-14 | Disposition: A | Discharge status: Home / Self Care | Payer: No Typology Code available for payment source | Source: Home / Self Care | Attending: Emergency Medicine | Admitting: Emergency Medicine

## 2014-04-14 DIAGNOSIS — T148 Other injury of unspecified body region: Secondary | ICD-10-CM

## 2014-04-14 DIAGNOSIS — T148XXA Other injury of unspecified body region, initial encounter: Secondary | ICD-10-CM

## 2014-04-14 MED ORDER — CYCLOBENZAPRINE HCL 5 MG PO TABS: 5.0000 mg | ORAL_TABLET | Freq: Three times a day (TID) | ORAL | Status: DC | PRN

## 2014-04-14 NOTE — ED Notes (Signed)
States he slipped after shower, did a full split. 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

## 2014-04-14 NOTE — Discharge Instructions (Signed)

## 2014-04-14 NOTE — ED Provider Notes (Signed)
CSN: [id number]     Arrival date & time 04/14/14  0818 History   First MD Initiated Contact with Patient 04/14/14 [phone]     Chief Complaint  Patient presents with  . Fall   (Consider location/radiation/quality/duration/timing/severity/associated sxs/prior Treatment) HPI Comments: After shower, one of pt's lymphedema wraps was wet and pt slipped on floor, "doing a full split" and causing pain to back of legs. Took Aleve, it helped a little. 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 Denies any other injury.   Patient is a 33 y.o. male presenting with fall. The history is provided by the patient.  Fall This is a new problem. The current episode started yesterday. The problem occurs rarely. The problem has been gradually improving. Exacerbated by: sitting. The symptoms are relieved by NSAIDs. Treatments tried: aleve. The treatment provided mild relief.    Past Medical History  Diagnosis Date  . Hypertension   . Respiratory failure 09/02/2008    s/p pneumonia, on ventilator and treacheotomy  . Wears glasses   . Venous stasis dermatitis   . Morbid obesity   . OSA (obstructive sleep apnea)   . CHF (congestive heart failure) 09/02/2008  . Elephantiasis   . Lymphedema of lower extremity     chronic, legs  . Enlarged heart    Past Surgical History  Procedure Laterality Date  . Tracheostomy     Family History  Problem Relation Age of Onset  . Hypertension    . Heart failure    . Diabetes Mother   . Heart disease Father   . Hyperlipidemia Father   . Cancer Neg Hx   . Alcohol abuse Neg Hx   . COPD Neg Hx   . Depression Neg Hx   . Drug abuse Neg Hx   . Early death Neg Hx   . Hearing loss Neg Hx   . Learning disabilities Neg Hx   . Stroke Neg Hx    History  Substance Use Topics  . Smoking status: Never Smoker   . Smokeless tobacco: Never Used  . Alcohol Use: Yes     Comment: "once in a blue moon."    Review of  Systems  Musculoskeletal:       Buttock and B posterior leg soreness. Denies joint pain  Skin: Negative for color change and wound.  Neurological: Negative for weakness and numbness.    Allergies  Review of patient's allergies indicates no known allergies.  Home Medications   Prior to Admission medications   Medication Sig Start Date End Date Taking? Authorizing Provider  furosemide (LASIX) 40 MG tablet Take 1 tablet (40 mg total) by mouth daily. 12/09/13  Yes Etta Grandchildhomas L Jones, MD  cyclobenzaprine (FLEXERIL) 5 MG tablet Take 1 tablet (5 mg total) by mouth 3 (three) times daily as needed for muscle spasms. 04/14/14   Cathlyn ParsonsAngela M Ettore Trebilcock, NP  meloxicam (MOBIC) 15 MG tablet Take 1 tablet (15 mg total) by mouth daily. 01/01/14   Chelle S Jeffery, PA-C  nebivolol (BYSTOLIC) 5 MG tablet Take 1 tablet (5 mg total) by mouth daily. 12/09/13   Etta Grandchildhomas L Jones, MD   BP 144/78 mmHg  Pulse 96  Temp(Src) 99 F (37.2 C) (Oral)  Resp 18  SpO2 94% Physical Exam  Constitutional: He appears well-developed and well-nourished. No distress.  Musculoskeletal:       Right upper leg: He exhibits no tenderness, no bony tenderness, no swelling  and no deformity.       Left upper leg: He exhibits no tenderness, no bony tenderness, no swelling and no deformity.  B buttocks non tender to palp.   Neurological: Gait normal.    ED Course  Procedures (including critical care time) Labs Review Labs Reviewed - No data to display  Imaging Review No results found.   MDM   1. Muscle strain   Muscle strain vs. Soft tissue injury. Rx flexeril 5mg  TID prn #21. Given note for work.      Cathlyn ParsonsAngela M Iram Astorino, NP 04/14/14 [id number]

## 2014-05-16 ENCOUNTER — Encounter (HOSPITAL_BASED_OUTPATIENT_CLINIC_OR_DEPARTMENT_OTHER): Payer: Self-pay | Attending: Internal Medicine

## 2014-05-16 DIAGNOSIS — L97929 Non-pressure chronic ulcer of unspecified part of left lower leg with unspecified severity: Secondary | ICD-10-CM | POA: Insufficient documentation

## 2014-05-16 DIAGNOSIS — I878 Other specified disorders of veins: Secondary | ICD-10-CM | POA: Insufficient documentation

## 2014-05-16 DIAGNOSIS — L97919 Non-pressure chronic ulcer of unspecified part of right lower leg with unspecified severity: Secondary | ICD-10-CM | POA: Insufficient documentation

## 2014-05-16 DIAGNOSIS — I890 Lymphedema, not elsewhere classified: Secondary | ICD-10-CM | POA: Insufficient documentation

## 2014-05-27 ENCOUNTER — Encounter: Payer: Self-pay | Admitting: Internal Medicine

## 2014-05-27 ENCOUNTER — Ambulatory Visit (INDEPENDENT_AMBULATORY_CARE_PROVIDER_SITE_OTHER): Payer: Self-pay | Admitting: Internal Medicine

## 2014-05-27 ENCOUNTER — Other Ambulatory Visit (INDEPENDENT_AMBULATORY_CARE_PROVIDER_SITE_OTHER): Payer: Self-pay

## 2014-05-27 VITALS — BP 139/90 | HR 102 | Temp 98.2°F | Resp 16 | Ht 71.0 in | Wt >= 6400 oz

## 2014-05-27 DIAGNOSIS — R739 Hyperglycemia, unspecified: Secondary | ICD-10-CM

## 2014-05-27 DIAGNOSIS — I10 Essential (primary) hypertension: Secondary | ICD-10-CM

## 2014-05-27 DIAGNOSIS — R609 Edema, unspecified: Secondary | ICD-10-CM

## 2014-05-27 DIAGNOSIS — J9610 Chronic respiratory failure, unspecified whether with hypoxia or hypercapnia: Secondary | ICD-10-CM

## 2014-05-27 DIAGNOSIS — G4733 Obstructive sleep apnea (adult) (pediatric): Secondary | ICD-10-CM

## 2014-05-27 LAB — BASIC METABOLIC PANEL
BUN: 10 mg/dL (ref 6–23)
CALCIUM: 9.1 mg/dL (ref 8.4–10.5)
CO2: 40 mEq/L — ABNORMAL HIGH (ref 19–32)
CREATININE: 0.87 mg/dL (ref 0.40–1.50)
Chloride: 95 mEq/L — ABNORMAL LOW (ref 96–112)
GFR: 129.66 mL/min (ref >60.00)
Glucose, Bld: 90 mg/dL (ref 70–99)
Potassium: 3.9 mEq/L (ref 3.5–5.1)
SODIUM: 137 meq/L (ref 135–145)

## 2014-05-27 LAB — HEMOGLOBIN A1C: HEMOGLOBIN A1C: 6.4 % (ref 4.6–6.5)

## 2014-05-27 LAB — URINALYSIS, ROUTINE W REFLEX MICROSCOPIC
BILIRUBIN URINE: NEGATIVE
Ketones, ur: NEGATIVE
Leukocytes, UA: NEGATIVE
NITRITE: NEGATIVE
SPECIFIC GRAVITY, URINE: 1.025 (ref 1.000–1.030)
Total Protein, Urine: 100 — AB
Urine Glucose: NEGATIVE
Urobilinogen, UA: 1 (ref 0.0–1.0)
pH: 6.5 (ref 5.0–8.0)

## 2014-05-27 LAB — TSH: TSH: 2.92 u[IU]/mL (ref 0.35–4.50)

## 2014-05-27 MED ORDER — AZILSARTAN MEDOXOMIL 40 MG PO TABS: 1.0000 | ORAL_TABLET | Freq: Every day | ORAL | Status: DC

## 2014-05-27 MED ORDER — TORSEMIDE 20 MG PO TABS: 20.0000 mg | ORAL_TABLET | Freq: Every day | ORAL | Status: DC

## 2014-05-27 NOTE — Progress Notes (Signed)
Subjective:    Patient ID: Tony Mccullough, male    DOB: 10-12-1980, 34 y.o.   MRN: [id number]  Hypertension This is a chronic problem. The current episode started more than 1 year ago. The problem is unchanged. The problem is uncontrolled. Associated symptoms include malaise/fatigue, peripheral edema and shortness of breath. Pertinent negatives include no anxiety, blurred vision, chest pain, headaches, neck pain, orthopnea, palpitations, PND or sweats. There are no associated agents to hypertension. Past treatments include diuretics. The current treatment provides mild improvement. Compliance problems include diet, exercise and psychosocial issues.  There is no history of angina, kidney disease, CAD/MI, CVA, heart failure, left ventricular hypertrophy, PVD or retinopathy. Identifiable causes of hypertension include sleep apnea.      Review of Systems  Constitutional: Positive for malaise/fatigue, fatigue and unexpected weight change. Negative for fever, chills, diaphoresis and appetite change.  HENT: Negative.   Eyes: Negative.  Negative for blurred vision.  Respiratory: Positive for apnea and shortness of breath. Negative for cough, choking, chest tightness, wheezing and stridor.   Cardiovascular: Negative.  Negative for chest pain, palpitations, orthopnea, leg swelling and PND.  Gastrointestinal: Negative.  Negative for nausea, vomiting, abdominal pain, diarrhea, constipation and blood in stool.  Endocrine: Negative.   Genitourinary: Negative.   Musculoskeletal: Negative.  Negative for back pain, arthralgias and neck pain.  Skin: Positive for wound (ulcers on both lower legs).  Allergic/Immunologic: Negative.   Neurological: Negative.  Negative for headaches.  Hematological: Negative.  Negative for adenopathy. Does not bruise/bleed easily.  Psychiatric/Behavioral: Negative.        Objective:   Physical Exam  Constitutional: He is oriented to person, place, and time. He appears  well-developed and well-nourished. No distress.  HENT:  Mouth/Throat: No oropharyngeal exudate.  Eyes: Conjunctivae are normal. Right eye exhibits no discharge. Left eye exhibits no discharge. No scleral icterus.  Neck: Normal range of motion. Neck supple. No JVD present. No tracheal deviation present. No thyromegaly present.  Cardiovascular: Normal rate, regular rhythm, normal heart sounds and intact distal pulses.  Exam reveals no gallop and no friction rub.   No murmur heard. Pulmonary/Chest: Effort normal and breath sounds normal. No stridor. No respiratory distress. He has no wheezes. He has no rales. He exhibits no tenderness.  Abdominal: Soft. Bowel sounds are normal. He exhibits no distension and no mass. There is no tenderness. There is no rebound and no guarding.  Musculoskeletal: Normal range of motion. He exhibits edema. He exhibits no tenderness.  There are dressings on BLE, under the dressings there is 1+ pitting edema  Lymphadenopathy:    He has no cervical adenopathy.  Neurological: He is oriented to person, place, and time.  Skin: Skin is warm and dry. No rash noted. He is not diaphoretic. No erythema. No pallor.  Psychiatric: He has a normal mood and affect. His behavior is normal. Judgment and thought content normal.  Vitals reviewed.    Lab Results  Component Value Date   WBC 8.5 10/08/2013   HGB 15.8 10/08/2013   HCT 46.8 10/08/2013   PLT 207.0 10/08/2013   GLUCOSE 99 10/08/2013   CHOL 115 10/08/2013   TRIG 98.0 10/08/2013   HDL 28.50* 10/08/2013   LDLCALC 67 10/08/2013   ALT 29 10/08/2013   AST 29 10/08/2013   NA 137 10/08/2013   K 3.9 10/08/2013   CL 96 10/08/2013   CREATININE 0.9 10/08/2013   BUN 12 10/08/2013   CO2 34* 10/08/2013   TSH 2.04 10/08/2013  INR 2.7* 10/18/2008   HGBA1C 6.3 10/08/2013   MICROALBUR 1.49 08/25/2008       Assessment & Plan:

## 2014-05-27 NOTE — Assessment & Plan Note (Signed)
His BP is not well controlled He prefers a diuretic that does not need to be taken twice a day so I will change to demadex I also think he would benefit from an ARB so will start edarbi I will monitor his lytes and renal function today

## 2014-05-27 NOTE — Assessment & Plan Note (Signed)
He will see pulm soon

## 2014-05-27 NOTE — Assessment & Plan Note (Signed)
Will change lasix to demadex to see if that is more effective

## 2014-05-27 NOTE — Patient Instructions (Signed)

## 2014-05-27 NOTE — Assessment & Plan Note (Signed)
I will check his A1C to see if he has developed DM2 

## 2014-05-27 NOTE — Progress Notes (Signed)
Pre visit review using our clinic review tool, if applicable. No additional management support is needed unless otherwise documented below in the visit note. 

## 2014-05-28 ENCOUNTER — Other Ambulatory Visit: Payer: Self-pay | Admitting: Internal Medicine

## 2014-05-28 ENCOUNTER — Ambulatory Visit (HOSPITAL_COMMUNITY)
Admission: RE | Admit: 2014-05-28 | Discharge: 2014-05-28 | Disposition: A | Discharge status: Home / Self Care | Payer: Self-pay | Source: Ambulatory Visit | Attending: Vascular Surgery | Admitting: Vascular Surgery

## 2014-05-28 DIAGNOSIS — R609 Edema, unspecified: Secondary | ICD-10-CM

## 2014-06-02 ENCOUNTER — Encounter: Payer: Self-pay | Admitting: Pulmonary Disease

## 2014-06-02 ENCOUNTER — Ambulatory Visit (INDEPENDENT_AMBULATORY_CARE_PROVIDER_SITE_OTHER): Payer: Self-pay | Admitting: Pulmonary Disease

## 2014-06-02 VITALS — BP 132/86 | HR 107 | Ht 73.0 in | Wt >= 6400 oz

## 2014-06-02 DIAGNOSIS — E662 Morbid (severe) obesity with alveolar hypoventilation: Secondary | ICD-10-CM

## 2014-06-02 DIAGNOSIS — Z6841 Body Mass Index (BMI) 40.0 and over, adult: Secondary | ICD-10-CM

## 2014-06-02 DIAGNOSIS — G4733 Obstructive sleep apnea (adult) (pediatric): Secondary | ICD-10-CM

## 2014-06-02 NOTE — Patient Instructions (Signed)
Will arrange for BiPAP and home night time oxygen set up Will arrange for referral to nutrition specialist Follow up in 3 months

## 2014-06-02 NOTE — Progress Notes (Signed)
Chief Complaint  Patient presents with  . Follow-up    Review sleep study.     History of Present Illness: Tony Mccullough is a 34 y.o. male with OSA and OHS.  He is here to review his sleep study.  He had trouble with his cell phone, and was not receiving messages.  As a result he has not been set up on therapy yet.  TESTS: 2011 >> Tracheostomy decannulation Echo 12/03/13 >> mild LVH, EF 60 to 65%, mild RA dilation Split 03/02/14 >> AHI 107.5, SaO2 low 60%. BiPAP 21/18 cm H2O with 3 liters oxygen >> AHI 0.    PMHx >> HTN, Diastolic heart failure, lymph edema lower extremities  PSHx, Medications, Allergies, Fhx, Shx reviewed.  Physical Exam: Blood pressure 132/86, pulse 107, height 6\' 1"  (1.854 m), weight 454 lb 9.6 oz (206.205 kg), SpO2 90 %. Body mass index is 59.99 kg/(m^2).  General - No distress, morbid obesity, foul smell ENT - No sinus tenderness, no oral exudate, no LAN, MP 4, tracheostomy stoma scare well healed, wears glasses Cardiac - s1s2 regular, no murmur Chest - No wheeze/rales/dullness Back - No focal tenderness Abd - Soft, non-tender Ext - 3+ lymphedema, with b/l lower extremities in wrap Neuro - Normal strength Skin - No rashes Psych - normal mood, and behavior  Assessment/Plan:  Obstructive sleep apnea with obesity hypoventilation syndrome. He has severe sleep apnea.  I have reviewed the recent sleep study results with the patient.  We discussed how sleep apnea can affect various health problems including risks for hypertension, cardiovascular disease, and diabetes.  We also discussed how sleep disruption can increase risks for accident, such as while driving.  Weight loss as a means of improving sleep apnea was also reviewed.  Additional treatment options discussed were CPAP therapy, oral appliance, and surgical intervention. Plan: - will arrange for BiPAP 21/18 cm H2O with 3 liters oxygen at night - he does not need daytime oxygen at present - explained  that he would need trach/nocturnal vent if he does not tolerate BiPAP/home oxygen or if his weight continues to increase  Morbid obesity. Plan: - discussed options to assist with weight loss - will arrange for referral to nutrition specialist - encouraged him to continue his exercise regimen   Coralyn HellingVineet Fumiko Cham, MD [facility] Pager:  [phone]

## 2014-06-12 ENCOUNTER — Encounter (HOSPITAL_BASED_OUTPATIENT_CLINIC_OR_DEPARTMENT_OTHER): Payer: Self-pay | Attending: Internal Medicine

## 2014-06-12 DIAGNOSIS — I890 Lymphedema, not elsewhere classified: Secondary | ICD-10-CM | POA: Insufficient documentation

## 2014-06-12 DIAGNOSIS — L97819 Non-pressure chronic ulcer of other part of right lower leg with unspecified severity: Secondary | ICD-10-CM | POA: Insufficient documentation

## 2014-06-12 DIAGNOSIS — I87333 Chronic venous hypertension (idiopathic) with ulcer and inflammation of bilateral lower extremity: Secondary | ICD-10-CM | POA: Insufficient documentation

## 2014-06-12 DIAGNOSIS — L97829 Non-pressure chronic ulcer of other part of left lower leg with unspecified severity: Secondary | ICD-10-CM | POA: Insufficient documentation

## 2014-06-13 ENCOUNTER — Ambulatory Visit: Payer: 59 | Admitting: Dietician

## 2014-06-13 ENCOUNTER — Encounter: Payer: Self-pay | Admitting: Dietician

## 2014-06-13 DIAGNOSIS — R7303 Prediabetes: Secondary | ICD-10-CM

## 2014-06-13 NOTE — Patient Instructions (Addendum)
Get your Bi pap Watch your sodium (salt).  Consider eating eating more at home to reduce sodium.  Season with herbs, spices, onions, garlic, Mrs Sharilyn SitesDash or [employer] Salt Free as examples. Rethink your drink..  More water.  Avoid regular soda. Be mindful of portion sizes. Increase vegetable intake.  Exercise as able and doctor allows.

## 2014-06-13 NOTE — Progress Notes (Signed)
  Medical Nutrition Therapy:  Appt start time: 1000 end time:  1115.   Assessment:  Primary concerns today: Patient wants to improve health and do what is needed to lose weight. He is here with his mother.   Hx includes obstructive sleep apnea with obesity hypoventilation syndrome.  BMI of 60.1.    Patient is to begin using BiPAP.  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  HgbA1C of 6.4 05/27/14.  He works at [employer] 3rd shift.  Has been thinking about going on short term disability to work on improving health.  Patient is here with mother.  He does his own shopping and both cook.  Preferred Learning Style:   No preference indicated   Learning Readiness:   Ready  Change in progress   MEDICATIONS: see list.  Unable to reconcile as patient did not bring current list.   DIETARY INTAKE:  Usual eating pattern includes 2-3 meals and 2 snacks per day.  Everyday foods include fast food Wendy's and Mind[employer] King.  Has cut back on the amount he orders and currently gets 1 combo.  2 burritos at [employer].  Avoided foods include avoids adding extra salt to food.    24-hr recall:  B (8 AM): 3 eggs with cheese and garlic salt with or without toast or 4 cups of cereal with milk 1%  Snk ( AM): none  L (1 PM): chicken tenders or orders [place]ese or pizza Snk (PM):  D (8 PM): Marinated chicken (baked without skin) or when mom cooks chicken,greens, corn and mac and cheese, or potatoes Snk ( PM): honey bun or candy or peanut butter and crackers or diorites or fresh fruit or 4 cups cereal Beverages: regular soda, fruit punch, lemonade, water with mio flavor, frappe   Usual physical activity: has a gym membership and plans to start going.    Estimated energy needs: 2100 calories 235 g carbohydrates 131 g protein 70 g fat  Progress Towards Goal(s):  In progress.   Nutritional Diagnosis:  NB-1.1 Food and nutrition-related knowledge deficit As related  to balance of carbohydrates, protein, fat and nutrition related to pre diabetes and weight control.  As evidenced by patient report and diet hx..    Intervention:  Nutrition Counseling. Get your Bi pap Watch your sodium (salt).  Consider eating eating more at home to reduce sodium.  Season with herbs, spices, onions, garlic, Mrs Sharilyn SitesDash or [employer] Salt Free as examples. Rethink your drink..  More water.  Avoid regular soda. Be mindful of portion sizes. Increase vegetable intake.  Exercise as able and doctor allows.  Teaching Method Utilized:  Visual Auditory Hands on  Handouts given during visit include:  My Plate  Snack list  Meal Card  Label reading  Barriers to learning/adherence to lifestyle change: overall health  Demonstrated degree of understanding via:  Teach Back   Monitoring/Evaluation:  Dietary intake, exercise, label reading, and body weight in 3 week(s).

## 2014-07-08 ENCOUNTER — Encounter: Payer: 59 | Attending: Pulmonary Disease | Admitting: Dietician

## 2014-07-08 VITALS — Ht 73.0 in | Wt >= 6400 oz

## 2014-07-08 DIAGNOSIS — R7303 Prediabetes: Secondary | ICD-10-CM

## 2014-07-08 DIAGNOSIS — R7309 Other abnormal glucose: Secondary | ICD-10-CM | POA: Insufficient documentation

## 2014-07-08 DIAGNOSIS — Z713 Dietary counseling and surveillance: Secondary | ICD-10-CM | POA: Insufficient documentation

## 2014-07-08 DIAGNOSIS — Z6841 Body Mass Index (BMI) 40.0 and over, adult: Secondary | ICD-10-CM | POA: Diagnosis not present

## 2014-07-08 NOTE — Progress Notes (Signed)
Medical Nutrition Therapy:  Appt start time: 1000 end time:  1115.   Assessment:  2/11: Primary concerns today: Patient wants to improve health and do what is needed to lose weight. He is here with his mother.   Hx includes obstructive sleep apnea with obesity hypoventilation syndrome.  BMI of 60.1.    Patient is to begin using BiPAP.  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  HgbA1C of 6.4 05/27/14.  He works at [employer] 3rd shift.  Has been thinking about going on short term disability to work on improving health.  Patient is here with mother.  He does his own shopping and both cook.  07/08/14: Patient is here with his mother.  Weight today is 450 lbs. He is using Mrs. Dash, eating more fruit, greek yogurt, using egg whites making sandwiches at home rather than fast food or eating subway, decreased fried foods.  He is not started the BiPAP yet due to insurance issues.  States that he is feeling more energetic.  Patient is still contemplating what to do with his job.  Patient is being treated for wounds on feet for the past 1-2 years.  Patient states that he has lymphadenia and gets wrap changed a couple times per week.  States that it is now healing.  Weight has decreased 14 lbs in the last month.  Patient states that he is considering Bariatric surgery.  Wt Readings from Last 3 Encounters:  06/13/14 464 lb (210.469 kg)  06/02/14 454 lb 9.6 oz (206.205 kg)  05/27/14 449 lb (203.665 kg)    Preferred Learning Style:   No preference indicated   Learning Readiness:   Ready  Change in progress   MEDICATIONS: see list.  Unable to reconcile as patient did not bring current list.   DIETARY INTAKE: Usual eating pattern includes 2-3 meals and 2 snacks per day.  Everyday foods include fast food Wendy's and Mindi Slicker.  Has cut back on the amount he orders and currently gets 1 combo.  2 burritos at [employer].  Avoided foods include avoids adding  extra salt to food.    24-hr recall: 06/19/14 B (8 AM): 3 eggs with cheese and garlic salt with or without toast or 4 cups of cereal with milk 1%  Snk ( AM): none  L (1 PM): chicken tenders or orders [place] or pizza Snk (PM):  D (8 PM): Marinated chicken (baked without skin) or when mom cooks chicken,greens, corn and mac and cheese, or potatoes Snk ( PM): honey bun or candy or peanut butter and crackers or diorites or fresh fruit or 4 cups cereal Beverages: regular soda, fruit punch, lemonade, water with mio flavor, frappe   Usual physical activity: has a gym membership and plans to start going.    24-hr recall:  07/08/14 B (8am):  3 Egg whites with 4 oz chicken with nonstick spray and Mrs. Dash. With or without toast or 3 cups sweetened cereal with milk (less than 1 month ago) L (1pm):  1 sandwich with [place] or Roasted chicken sub and increased his vegetables on flat bread D (8pm):  Chicken with starch, vegetables Snacks while at work:  nutrigrain bars, fruit, fruit snacks  Estimated energy needs: 2100 calories 235 g carbohydrates 158 g protein 58 g fat  Progress Towards Goal(s):  In progress.   Nutritional Diagnosis:  NB-1.1 Food and nutrition-related knowledge deficit As related to balance of carbohydrates, protein, fat and nutrition related to  pre diabetes and weight control.  As evidenced by patient report and diet hx..    Intervention:  Nutrition Counseling. Reviewed healthy eating for weight loss and blood sugar control as well as increased protein needs for wound healing.  Great job on the changes that you have made! Consider fresh fruit rather than "fruit snacks" Egg whites for breakfast is a better choice than cereal.  Also consider the smoothie idea written on your snack list. Protein with each snack.  Consider trying raw veges with humus. Continue to be mindful about portions sizes. Exercise as able and doctor allows. Continue the salt free seasonings. Continue to  drink the water.  Flavored water is fine.  If you choose juice do this more infrequently and dilute with water.  Teaching Method Utilized:  Visual Auditory  Handouts given during visit include:  Snack list  Meal Card  Barriers to learning/adherence to lifestyle change: overall health  Demonstrated degree of understanding via:  Teach Back   Monitoring/Evaluation:  Dietary intake, exercise, label reading, and body weight in 1 month.

## 2014-07-08 NOTE — Patient Instructions (Signed)
Great job on the changes that you have made! Consider fresh fruit rather than "fruit snacks" Egg whites for breakfast is a better choice than cereal.  Also consider the smoothie idea written on your snack list. Protein with each snack.  Consider trying raw veges with humus. Continue to be mindful about portions sizes. Exercise as able and doctor allows. Continue the salt free seasonings. Continue to drink the water.  Flavored water is fine.  If you choose juice do this more infrequently and dilute with water.

## 2014-07-11 ENCOUNTER — Encounter (HOSPITAL_BASED_OUTPATIENT_CLINIC_OR_DEPARTMENT_OTHER): Payer: 59 | Attending: Internal Medicine

## 2014-07-11 DIAGNOSIS — I890 Lymphedema, not elsewhere classified: Secondary | ICD-10-CM | POA: Diagnosis not present

## 2014-07-11 DIAGNOSIS — I87333 Chronic venous hypertension (idiopathic) with ulcer and inflammation of bilateral lower extremity: Secondary | ICD-10-CM | POA: Diagnosis present

## 2014-07-14 DIAGNOSIS — I87333 Chronic venous hypertension (idiopathic) with ulcer and inflammation of bilateral lower extremity: Secondary | ICD-10-CM | POA: Diagnosis not present

## 2014-07-18 DIAGNOSIS — I87333 Chronic venous hypertension (idiopathic) with ulcer and inflammation of bilateral lower extremity: Secondary | ICD-10-CM | POA: Diagnosis not present

## 2014-07-18 DIAGNOSIS — I890 Lymphedema, not elsewhere classified: Secondary | ICD-10-CM | POA: Diagnosis not present

## 2014-07-22 DIAGNOSIS — I87333 Chronic venous hypertension (idiopathic) with ulcer and inflammation of bilateral lower extremity: Secondary | ICD-10-CM | POA: Diagnosis not present

## 2014-07-25 DIAGNOSIS — I87333 Chronic venous hypertension (idiopathic) with ulcer and inflammation of bilateral lower extremity: Secondary | ICD-10-CM | POA: Diagnosis not present

## 2014-07-26 ENCOUNTER — Encounter: Payer: Self-pay | Admitting: Family Medicine

## 2014-07-26 ENCOUNTER — Ambulatory Visit (INDEPENDENT_AMBULATORY_CARE_PROVIDER_SITE_OTHER): Payer: 59 | Admitting: Family Medicine

## 2014-07-26 VITALS — BP 134/88 | HR 91 | Temp 98.5°F | Wt >= 6400 oz

## 2014-07-26 DIAGNOSIS — K1120 Sialoadenitis, unspecified: Secondary | ICD-10-CM

## 2014-07-26 MED ORDER — AMOXICILLIN-POT CLAVULANATE 875-125 MG PO TABS: 1.0000 | ORAL_TABLET | Freq: Two times a day (BID) | ORAL | Status: DC

## 2014-07-26 NOTE — Progress Notes (Signed)
   [facility] [address] [place], [place] [id number] Phone: [phone]  Patient ID: Tony GuestWilliam D Haught MRN: [id number], DOB: 09/11/1980, 34 y.o. Date of Encounter: 07/26/2014  Primary Physician:  Sanda Lingerhomas Jones, MD   Chief Complaint  Patient presents with  . Adenopathy    x 2 days--right and left side with assoc. soreness in areas--felt like tooth pain--    Subjective:   History of Present Illness:  Tony GuestWilliam D Pate is a 34 y.o. pleasant patient who presents with the following:  R parotid. The patient was feeling somewhat sick earlier in the week and he had some runny nose and congestion.  He also developed some swelling around the lateral aspect of either jaw.  Now the left side is improved but the right side still is painful.  He is having some pain at the corner of the jaw itself and he also has some enlargement of his lymph nodes in his neck.  Sick and earlier in the week, noticed some swelling in his jaw. Still tender. Does not have a toothache.   The PMH, PSH, Social History, Family History, Medications, and allergies have [facility], and have been updated if relevant.  ROS: GEN: Acute illness details above GI: Tolerating PO intake GU: maintaining adequate hydration and urination Pulm: No SOB Interactive and getting along well at home.  Otherwise, ROS is as per the HPI.   Objective:   Physical Examination: BP 134/88 mmHg  Pulse 91  Temp(Src) 98.5 F (36.9 C) (Oral)  Wt 452 lb (205.026 kg)   GEN: WDWN, NAD, Non-toxic, A & O x 3 HEENT: Atraumatic, Normocephalic. Neck supple. Enlargement greater at R parotid. + ant LAD B. Poor dentition. Ears and Nose: No external deformity. TM clear. CV: RRR, No M/G/R. No JVD. No thrill. No extra heart sounds. PULM: CTA B, no wheezes, crackles, rhonchi. No retractions. No resp. distress. No accessory muscle use. EXTR: No c/c/e NEURO Normal gait.  PSYCH: Normally interactive.  Conversant. Not depressed or anxious appearing.  Calm demeanor.   Laboratory and Imaging Data:  Assessment & Plan:   Parotiditis   Possibly infectious, sometimes spontaneous with parotid duct blockage.   Girth, difficult to examine, may be tooth related - if no improvement, advised dental eval.  Follow-up: No Follow-up on file.  New Prescriptions   AMOXICILLIN-CLAVULANATE (AUGMENTIN) 875-125 MG PER TABLET    Take 1 tablet by mouth 2 (two) times daily.   No orders of the defined types were placed in this encounter.    Signed,  Elpidio GaleaSpencer T. Daron Breeding, MD   Patient's Medications  New Prescriptions   AMOXICILLIN-CLAVULANATE (AUGMENTIN) 875-125 MG PER TABLET    Take 1 tablet by mouth 2 (two) times daily.  Previous Medications   AZILSARTAN MEDOXOMIL (EDARBI) 40 MG TABS    Take 1 tablet by mouth daily.   TORSEMIDE (DEMADEX) 20 MG TABLET    Take 1 tablet (20 mg total) by mouth daily.  Modified Medications   No medications on file  Discontinued Medications   No medications on file

## 2014-07-26 NOTE — Progress Notes (Signed)
Pre visit review using our clinic review tool, if applicable. No additional management support is needed unless otherwise documented below in the visit note. 

## 2014-08-01 DIAGNOSIS — I87333 Chronic venous hypertension (idiopathic) with ulcer and inflammation of bilateral lower extremity: Secondary | ICD-10-CM | POA: Diagnosis not present

## 2014-08-01 DIAGNOSIS — I890 Lymphedema, not elsewhere classified: Secondary | ICD-10-CM | POA: Diagnosis not present

## 2014-08-07 ENCOUNTER — Ambulatory Visit (INDEPENDENT_AMBULATORY_CARE_PROVIDER_SITE_OTHER): Payer: 59 | Admitting: Internal Medicine

## 2014-08-07 ENCOUNTER — Encounter: Payer: Self-pay | Admitting: Internal Medicine

## 2014-08-07 ENCOUNTER — Other Ambulatory Visit (INDEPENDENT_AMBULATORY_CARE_PROVIDER_SITE_OTHER): Payer: 59

## 2014-08-07 VITALS — BP 136/84 | HR 107 | Temp 98.6°F | Resp 20 | Ht 71.0 in | Wt >= 6400 oz

## 2014-08-07 DIAGNOSIS — R739 Hyperglycemia, unspecified: Secondary | ICD-10-CM

## 2014-08-07 DIAGNOSIS — L97929 Non-pressure chronic ulcer of unspecified part of left lower leg with unspecified severity: Secondary | ICD-10-CM

## 2014-08-07 DIAGNOSIS — I83019 Varicose veins of right lower extremity with ulcer of unspecified site: Secondary | ICD-10-CM

## 2014-08-07 DIAGNOSIS — I872 Venous insufficiency (chronic) (peripheral): Secondary | ICD-10-CM

## 2014-08-07 DIAGNOSIS — G4733 Obstructive sleep apnea (adult) (pediatric): Secondary | ICD-10-CM

## 2014-08-07 DIAGNOSIS — I10 Essential (primary) hypertension: Secondary | ICD-10-CM

## 2014-08-07 DIAGNOSIS — E662 Morbid (severe) obesity with alveolar hypoventilation: Secondary | ICD-10-CM

## 2014-08-07 DIAGNOSIS — J9611 Chronic respiratory failure with hypoxia: Secondary | ICD-10-CM

## 2014-08-07 DIAGNOSIS — I83029 Varicose veins of left lower extremity with ulcer of unspecified site: Secondary | ICD-10-CM

## 2014-08-07 DIAGNOSIS — L97919 Non-pressure chronic ulcer of unspecified part of right lower leg with unspecified severity: Secondary | ICD-10-CM

## 2014-08-07 LAB — CBC WITH DIFFERENTIAL/PLATELET
BASOS ABS: 0 10*3/uL (ref 0.0–0.1)
Basophils Relative: 0.2 % (ref 0.0–3.0)
Eosinophils Absolute: 0.2 10*3/uL (ref 0.0–0.7)
Eosinophils Relative: 2.5 % (ref 0.0–5.0)
HCT: 53.5 % — ABNORMAL HIGH (ref 39.0–52.0)
Hemoglobin: 17.7 g/dL — ABNORMAL HIGH (ref 13.0–17.0)
LYMPHS PCT: 16.4 % (ref 12.0–46.0)
Lymphs Abs: 1.3 10*3/uL (ref 0.7–4.0)
MCHC: 33 g/dL (ref 30.0–36.0)
MCV: 94.1 fl (ref 78.0–100.0)
Monocytes Absolute: 0.9 10*3/uL (ref 0.1–1.0)
Monocytes Relative: 12.1 % — ABNORMAL HIGH (ref 3.0–12.0)
NEUTROS PCT: 68.8 % (ref 43.0–77.0)
Neutro Abs: 5.4 10*3/uL (ref 1.4–7.7)
PLATELETS: 191 10*3/uL (ref 150.0–400.0)
RBC: 5.68 Mil/uL (ref 4.22–5.81)
RDW: 17.1 % — ABNORMAL HIGH (ref 11.5–15.5)
WBC: 7.8 10*3/uL (ref 4.0–10.5)

## 2014-08-07 LAB — BASIC METABOLIC PANEL
BUN: 11 mg/dL (ref 6–23)
CO2: 39 mEq/L — ABNORMAL HIGH (ref 19–32)
Calcium: 9.3 mg/dL (ref 8.4–10.5)
Chloride: 94 mEq/L — ABNORMAL LOW (ref 96–112)
Creatinine, Ser: 0.8 mg/dL (ref 0.40–1.50)
GFR: 142.67 mL/min (ref >60.00)
Glucose, Bld: 110 mg/dL — ABNORMAL HIGH (ref 70–99)
POTASSIUM: 3.7 meq/L (ref 3.5–5.1)
Sodium: 138 mEq/L (ref 135–145)

## 2014-08-07 LAB — HEMOGLOBIN A1C: Hgb A1c MFr Bld: 6.4 % (ref 4.6–6.5)

## 2014-08-07 MED ORDER — AZILSARTAN MEDOXOMIL 40 MG PO TABS
1.0000 | ORAL_TABLET | Freq: Every day | ORAL | Status: DC
Start: 2014-08-07 — End: 2014-10-28

## 2014-08-07 NOTE — Progress Notes (Signed)
Pre visit review using our clinic review tool, if applicable. No additional management support is needed unless otherwise documented below in the visit note. 

## 2014-08-07 NOTE — Assessment & Plan Note (Signed)
He is due for a f/up with pulmonary medicine

## 2014-08-07 NOTE — Assessment & Plan Note (Signed)
Cont f/up with wound care

## 2014-08-07 NOTE — Assessment & Plan Note (Signed)
His BP is adequately well controlled Will monitor his lytes and renal function 

## 2014-08-07 NOTE — Progress Notes (Signed)
Subjective:    Patient ID: Tony Mccullough, male    DOB: 1981-01-29, 34 y.o.   MRN: [id number]  Hypertension This is a chronic problem. The current episode started more than 1 year ago. The problem is unchanged. The problem is controlled. Associated symptoms include malaise/fatigue, orthopnea, peripheral edema and shortness of breath. Pertinent negatives include no anxiety, blurred vision, chest pain, headaches, neck pain, palpitations, PND or sweats. There are no associated agents to hypertension. Past treatments include angiotensin blockers and diuretics. The current treatment provides moderate improvement. Compliance problems include diet, exercise and psychosocial issues.  Identifiable causes of hypertension include sleep apnea.      Review of Systems  Constitutional: Positive for malaise/fatigue, fatigue and unexpected weight change (some wt gain). Negative for fever, chills, diaphoresis and appetite change.  HENT: Negative.  Negative for trouble swallowing.   Eyes: Negative.  Negative for blurred vision.  Respiratory: Positive for apnea and shortness of breath. Negative for cough, choking, chest tightness, wheezing and stridor.   Cardiovascular: Positive for orthopnea and leg swelling. Negative for chest pain, palpitations and PND.  Gastrointestinal: Negative.  Negative for nausea, vomiting, abdominal pain, diarrhea, constipation and blood in stool.  Endocrine: Negative.  Negative for polydipsia, polyphagia and polyuria.  Genitourinary: Negative.   Musculoskeletal: Positive for myalgias and arthralgias. Negative for back pain, joint swelling, gait problem, neck pain and neck stiffness.       He has painful wounds over BLE  Skin: Positive for wound. Negative for rash.  Allergic/Immunologic: Negative.   Neurological: Negative.  Negative for dizziness, syncope, speech difficulty, weakness, light-headedness, numbness and headaches.  Hematological: Negative.  Negative for adenopathy. Does  not bruise/bleed easily.  Psychiatric/Behavioral: Negative.        Objective:   Physical Exam  Constitutional: He is oriented to person, place, and time. He appears well-developed and well-nourished.  Non-toxic appearance. He has a sickly appearance. He appears ill. No distress.  MO and foul-smelling  HENT:  Head: Normocephalic and atraumatic.  Mouth/Throat: Oropharynx is clear and moist. No oropharyngeal exudate.  Eyes: Conjunctivae are normal. Right eye exhibits no discharge. Left eye exhibits no discharge. No scleral icterus.  Neck: Normal range of motion. Neck supple. No JVD present. No tracheal deviation present. No thyromegaly present.  Cardiovascular: Normal rate, regular rhythm, normal heart sounds and intact distal pulses.  Exam reveals no gallop and no friction rub.   No murmur heard. Pulmonary/Chest: Effort normal and breath sounds normal. No stridor. No respiratory distress. He has no wheezes. He has no rales. He exhibits no tenderness.  Abdominal: Soft. Bowel sounds are normal. He exhibits no distension. There is no tenderness. There is no rebound and no guarding.  Musculoskeletal: Normal range of motion. He exhibits edema. He exhibits no tenderness.  LE's are covered with dressings  Lymphadenopathy:    He has no cervical adenopathy.  Neurological: He is oriented to person, place, and time.  Skin: Skin is warm and dry. No rash noted. He is not diaphoretic. No erythema. No pallor.  Vitals reviewed.     Lab Results  Component Value Date   WBC 8.5 10/08/2013   HGB 15.8 10/08/2013   HCT 46.8 10/08/2013   PLT 207.0 10/08/2013   GLUCOSE 90 05/27/2014   CHOL 115 10/08/2013   TRIG 98.0 10/08/2013   HDL 28.50* 10/08/2013   LDLCALC 67 10/08/2013   ALT 29 10/08/2013   AST 29 10/08/2013   NA 137 05/27/2014   K 3.9 05/27/2014  CL 95* 05/27/2014   CREATININE 0.87 05/27/2014   BUN 10 05/27/2014   CO2 40* 05/27/2014   TSH 2.92 05/27/2014   INR 2.7* 10/18/2008   HGBA1C  6.4 05/27/2014   MICROALBUR 1.49 08/25/2008       Assessment & Plan:

## 2014-08-07 NOTE — Patient Instructions (Signed)

## 2014-08-08 ENCOUNTER — Encounter (HOSPITAL_BASED_OUTPATIENT_CLINIC_OR_DEPARTMENT_OTHER): Payer: 59 | Attending: Internal Medicine

## 2014-08-08 DIAGNOSIS — I87333 Chronic venous hypertension (idiopathic) with ulcer and inflammation of bilateral lower extremity: Secondary | ICD-10-CM | POA: Diagnosis not present

## 2014-08-08 DIAGNOSIS — L97811 Non-pressure chronic ulcer of other part of right lower leg limited to breakdown of skin: Secondary | ICD-10-CM | POA: Insufficient documentation

## 2014-08-08 DIAGNOSIS — I890 Lymphedema, not elsewhere classified: Secondary | ICD-10-CM | POA: Insufficient documentation

## 2014-08-08 DIAGNOSIS — B964 Proteus (mirabilis) (morganii) as the cause of diseases classified elsewhere: Secondary | ICD-10-CM | POA: Insufficient documentation

## 2014-08-08 DIAGNOSIS — L97821 Non-pressure chronic ulcer of other part of left lower leg limited to breakdown of skin: Secondary | ICD-10-CM | POA: Insufficient documentation

## 2014-08-08 DIAGNOSIS — L0889 Other specified local infections of the skin and subcutaneous tissue: Secondary | ICD-10-CM | POA: Insufficient documentation

## 2014-08-14 ENCOUNTER — Encounter: Payer: 59 | Attending: Pulmonary Disease | Admitting: Dietician

## 2014-08-14 DIAGNOSIS — Z6841 Body Mass Index (BMI) 40.0 and over, adult: Secondary | ICD-10-CM | POA: Insufficient documentation

## 2014-08-14 DIAGNOSIS — Z713 Dietary counseling and surveillance: Secondary | ICD-10-CM | POA: Diagnosis not present

## 2014-08-14 DIAGNOSIS — R7303 Prediabetes: Secondary | ICD-10-CM

## 2014-08-14 DIAGNOSIS — R7309 Other abnormal glucose: Secondary | ICD-10-CM | POA: Diagnosis not present

## 2014-08-14 NOTE — Patient Instructions (Signed)
Increase your vegetable intake as able. Egg whites for breakfast is a better choice than cereal.   When you have cereal, buy an unsweetened cereal and keep portion size to 1 cup. Rethink what you drink.  Water with a "splash" of juice is OK but try to avoid drinking juice.  Fresh fruit is better. Consider trying raw veges and hummus or plain greek yogurt with ranch seasoning mix. Continue to be mindful about portion size.   Continue the salt free seasonings.

## 2014-08-14 NOTE — Progress Notes (Signed)
Medical Nutrition Therapy:  Appt start time: 1000 end time:  1115.   Assessment:  2/11: Primary concerns today: Patient wants to improve health and do what is needed to lose weight. He is here with his mother.   Hx includes obstructive sleep apnea with obesity hypoventilation syndrome.  BMI of 60.1.    Patient is to begin using BiPAP.  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  HgbA1C of 6.4 05/27/14.  He works at [employer] 3rd shift.  Has been thinking about going on short term disability to work on improving health.  Patient is here with mother.  He does his own shopping and both cook.  07/08/14: Patient is here with his mother.  Weight today is 450 lbs. He is using Mrs. Dash, eating more fruit, greek yogurt, using egg whites making sandwiches at home rather than fast food or eating subway, decreased fried foods.  He is not started the BiPAP yet due to insurance issues.  States that he is feeling more energetic.  Patient is still contemplating what to do with his job.  Patient is being treated for wounds on feet for the past 1-2 years.  Patient states that he has lymphadenia and gets wrap changed a couple times per week.  States that it is now healing.  Weight has decreased 14 lbs in the last month.  Patient states that he is considering Bariatric surgery.  08/14/14: Patient is here along.  He has gained another pound up to 457 lbs.  He reports that he has continued to avoid fried foods.  He is still working and is still considering disability.  He states that he has decided to try Bariatric surgery.  He continues to have problems with insurance and still does not have the C-pap. States that 1 wound is healing but more have opened on the other leg.  Some parts of diet remain high in refined sugar.  Challenge affording healthy food.  Lives with mother.  HgbA1C remains stable at 6.4% 08/07/14.  Wt Readings from Last 3 Encounters:  08/07/14 456 lb (206.84 kg)   07/26/14 452 lb (205.026 kg)  07/08/14 450 lb (204.119 kg)    Preferred Learning Style:   No preference indicated   Learning Readiness:   Ready  Change in progress   MEDICATIONS: see list.  Unable to reconcile as patient did not bring current list.   DIETARY INTAKE: Usual eating pattern includes 2-3 meals and 2 snacks per day.  24-hr recall:   B (8am):  3 Egg whites with 4 oz chicken with nonstick spray and Mrs. Dash. With greek yogurt and 3 cups applejacks with 2%. Snack:  Fruit smoothie (strawberries, pineapple slices or apple slices, greek yogurt, milk) L (1pm):  1 sandwich with [place] or Roasted chicken sub and increased his vegetables on flat bread or subway flatbread sancwich D (8pm):  BBQ pork chop and spinach Snacks while at work:  [place] yogurt, quaker oat granola bars (strawberry and yogurt) Beverages:  Water, mixed 50/50 water and juice, occasional regular soda at work  Estimated energy needs: 2100 calories 235 g carbohydrates 158 g protein 58 g fat  Progress Towards Goal(s):  In progress.   Nutritional Diagnosis:  NB-1.1 Food and nutrition-related knowledge deficit As related to balance of carbohydrates, protein, fat and nutrition related to pre diabetes and weight control.  As evidenced by patient report and diet hx..    Intervention:  Nutrition Counseling. Reviewed healthy eating for weight  loss and blood sugar control as well as increased protein needs for wound healing.  Called patient with information to register for a seminar on bariatric surgery at [facility] Classroom 1, April 12th from 6-8 pm, call [phone] to register.  Increase your vegetable intake as able. Egg whites for breakfast is a better choice than cereal.   When you have cereal, buy an unsweetened cereal and keep portion size to 1 cup. Rethink what you drink.  Water with a "splash" of juice is OK but try to avoid drinking juice.  Fresh fruit is better. Consider  trying raw veges and hummus or plain greek yogurt with ranch seasoning mix. Continue to be mindful about portion size.   Continue the salt free seasonings.   Teaching Method Utilized:  Visual Auditory  Handouts given during visit include:  Snack list  Meal Card  Barriers to learning/adherence to lifestyle change: overall health  Demonstrated degree of understanding via:  Teach Back   Monitoring/Evaluation:  Dietary intake, exercise, label reading, and body weight in 5 weeks.

## 2014-08-15 DIAGNOSIS — I890 Lymphedema, not elsewhere classified: Secondary | ICD-10-CM | POA: Diagnosis not present

## 2014-08-15 DIAGNOSIS — L97821 Non-pressure chronic ulcer of other part of left lower leg limited to breakdown of skin: Secondary | ICD-10-CM | POA: Diagnosis not present

## 2014-08-15 DIAGNOSIS — I87333 Chronic venous hypertension (idiopathic) with ulcer and inflammation of bilateral lower extremity: Secondary | ICD-10-CM | POA: Diagnosis not present

## 2014-08-15 DIAGNOSIS — L97811 Non-pressure chronic ulcer of other part of right lower leg limited to breakdown of skin: Secondary | ICD-10-CM | POA: Diagnosis not present

## 2014-08-18 ENCOUNTER — Inpatient Hospital Stay (HOSPITAL_COMMUNITY)
Admission: EM | Admit: 2014-08-18 | Discharge: 2014-08-22 | DRG: 189 | Disposition: A | Discharge status: Home / Self Care | Payer: 59 | Attending: Internal Medicine | Admitting: Internal Medicine

## 2014-08-18 ENCOUNTER — Encounter (HOSPITAL_COMMUNITY): Payer: Self-pay | Admitting: Emergency Medicine

## 2014-08-18 DIAGNOSIS — E662 Morbid (severe) obesity with alveolar hypoventilation: Secondary | ICD-10-CM | POA: Diagnosis present

## 2014-08-18 DIAGNOSIS — Z79899 Other long term (current) drug therapy: Secondary | ICD-10-CM

## 2014-08-18 DIAGNOSIS — J309 Allergic rhinitis, unspecified: Secondary | ICD-10-CM | POA: Diagnosis present

## 2014-08-18 DIAGNOSIS — G9340 Encephalopathy, unspecified: Secondary | ICD-10-CM | POA: Diagnosis present

## 2014-08-18 DIAGNOSIS — I5032 Chronic diastolic (congestive) heart failure: Secondary | ICD-10-CM | POA: Diagnosis present

## 2014-08-18 DIAGNOSIS — R079 Chest pain, unspecified: Secondary | ICD-10-CM

## 2014-08-18 DIAGNOSIS — J9600 Acute respiratory failure, unspecified whether with hypoxia or hypercapnia: Secondary | ICD-10-CM | POA: Diagnosis present

## 2014-08-18 DIAGNOSIS — L97919 Non-pressure chronic ulcer of unspecified part of right lower leg with unspecified severity: Secondary | ICD-10-CM | POA: Diagnosis present

## 2014-08-18 DIAGNOSIS — J9621 Acute and chronic respiratory failure with hypoxia: Secondary | ICD-10-CM | POA: Diagnosis not present

## 2014-08-18 DIAGNOSIS — Z22322 Carrier or suspected carrier of Methicillin resistant Staphylococcus aureus: Secondary | ICD-10-CM

## 2014-08-18 DIAGNOSIS — Z6841 Body Mass Index (BMI) 40.0 and over, adult: Secondary | ICD-10-CM

## 2014-08-18 DIAGNOSIS — R0602 Shortness of breath: Secondary | ICD-10-CM | POA: Insufficient documentation

## 2014-08-18 DIAGNOSIS — I10 Essential (primary) hypertension: Secondary | ICD-10-CM | POA: Diagnosis present

## 2014-08-18 DIAGNOSIS — J9622 Acute and chronic respiratory failure with hypercapnia: Secondary | ICD-10-CM | POA: Diagnosis present

## 2014-08-18 DIAGNOSIS — R778 Other specified abnormalities of plasma proteins: Secondary | ICD-10-CM | POA: Diagnosis present

## 2014-08-18 DIAGNOSIS — E872 Acidosis: Secondary | ICD-10-CM | POA: Diagnosis present

## 2014-08-18 DIAGNOSIS — J9601 Acute respiratory failure with hypoxia: Secondary | ICD-10-CM | POA: Diagnosis not present

## 2014-08-18 DIAGNOSIS — R7989 Other specified abnormal findings of blood chemistry: Secondary | ICD-10-CM | POA: Diagnosis present

## 2014-08-18 DIAGNOSIS — I83009 Varicose veins of unspecified lower extremity with ulcer of unspecified site: Secondary | ICD-10-CM | POA: Diagnosis present

## 2014-08-18 DIAGNOSIS — L97929 Non-pressure chronic ulcer of unspecified part of left lower leg with unspecified severity: Secondary | ICD-10-CM | POA: Diagnosis present

## 2014-08-18 NOTE — ED Notes (Signed)
The patient said he has been having chest pain and SOB for a while.  The patien said it has gotten worse and so he decided o come in.  He says he has been hiagnosed wtih CHF and had to be hospitalized for it.   He also said he had a trach back in 2009.

## 2014-08-18 NOTE — ED Provider Notes (Signed)
CSN: [id number]     Arrival date & time 08/18/14  2308 History   None  [id number]  This chart was scribed for Tony Milchakesh Alva V, MD by Arlan OrganAshley Mccullough, ED Scribe. This patient was seen in room 2M09C/2M09C-01 and the patient's care was started 11:28 PM.   Chief Complaint  Patient presents with  . Chest Pain    The patient said he has been having chest pain and SOB for a while.  The patien said it has gotten worse and so he decided o come in.  He says he has been hiagnosed wtih CHF and had to be hospitalized for it.   . Shortness of Breath   HPI  HPI Comments: Tony Mccullough is a 34 y.o. male with a PMHx of HTN, CHF, trach placed in 2009, and lymphedema of lower extremity who presents to the Emergency Department complaining of constant shortness of breath. Dyspnea started few days ago, and has gotten worse. Pt states "it feels like my lung capacity has shrunk".  On typical day, pt states he is able to exert himself without experiencing any shortness of breath. However, Mr. Tony Mccullough noticed himself getting short of breath after walking only a short distance since time of onset. Pt does not currently use oxygen at home to help on daily basis. No recent medication changes. Mr. Tony Mccullough is currently on a fluid pill and states he has been taking this medication as directed. However, he admits to missing a few dosages last week after needing a refill. No recent chest pain, fever, or chills. No known allergies to medications.  Past Medical History  Diagnosis Date  . Hypertension   . Respiratory failure 09/02/2008    s/p pneumonia, on ventilator and treacheotomy  . Wears glasses   . Venous stasis dermatitis   . Morbid obesity   . OSA (obstructive sleep apnea)   . CHF (congestive heart failure) 09/02/2008  . Elephantiasis   . Lymphedema of lower extremity     chronic, legs  . Enlarged heart    Past Surgical History  Procedure Laterality Date  . Tracheostomy     Family History  Problem Relation Age of Onset  .  Hypertension    . Heart failure    . Diabetes Mother   . Heart disease Father   . Hyperlipidemia Father   . Cancer Neg Hx   . Alcohol abuse Neg Hx   . COPD Neg Hx   . Depression Neg Hx   . Drug abuse Neg Hx   . Early death Neg Hx   . Hearing loss Neg Hx   . Learning disabilities Neg Hx   . Stroke Neg Hx    History  Substance Use Topics  . Smoking status: Never Smoker   . Smokeless tobacco: Never Used  . Alcohol Use: Yes     Comment: "once in a blue moon."    Review of Systems  Constitutional: Negative for fever, chills and diaphoresis.  Respiratory: Positive for cough (productive) and shortness of breath.   Cardiovascular: Negative for chest pain.  Gastrointestinal: Negative for abdominal pain.  Musculoskeletal: Negative for back pain.  Skin: Negative for rash.  Neurological: Negative for dizziness.  Psychiatric/Behavioral: Negative for confusion.  All other systems reviewed and are negative.     Allergies  Review of patient's allergies indicates no known allergies.  Home Medications   Prior to Admission medications   Medication Sig Start Date End Date Taking? Authorizing Provider  torsemide (DEMADEX) 20 MG  tablet Take 1 tablet (20 mg total) by mouth daily. 05/27/14  Yes Etta Grandchild, MD  Azilsartan Medoxomil (EDARBI) 40 MG TABS Take 1 tablet by mouth daily. Patient not taking: Reported on 08/19/2014 08/07/14   Etta Grandchild, MD   Triage Vitals: BP 107/69 mmHg  Pulse 87  Temp(Src) 97.5 F (36.4 C) (Oral)  Resp 21  Ht  (1.854 m)  Wt 455 lb (206.387 kg)  BMI 60.04 kg/m2  SpO2 87%   Physical Exam  Constitutional: He is oriented to person, place, and time. He appears well-developed and well-nourished.  HENT:  Head: Normocephalic and atraumatic.  Eyes: EOM are normal.  Neck: Normal range of motion.  Cardiovascular: Normal rate, regular rhythm, normal heart sounds and intact distal pulses.  Exam reveals no gallop and no friction rub.   No murmur  heard. Pulmonary/Chest: Effort normal and breath sounds normal. No respiratory distress. He has no wheezes. He has no rales.  Clear to auscultation No rhonchi noted on exam   Abdominal: Soft. He exhibits no distension. There is no tenderness.  Musculoskeletal: Normal range of motion.  Lower extremity exam is limited due to compression wraps  Neurological: He is alert and oriented to person, place, and time.  Skin: Skin is warm and dry.  Psychiatric: He has a normal mood and affect. Judgment normal.  Nursing note and vitals reviewed.   ED Course  Procedures (including critical care time)  DIAGNOSTIC STUDIES: Oxygen Saturation is 79% on RA, low by my interpretation.    COORDINATION OF CARE: 10:19 PM-Discussed treatment plan with pt at bedside and pt agreed to plan.     Labs Review Labs Reviewed  MRSA PCR SCREENING - Abnormal; Notable for the following:    MRSA by PCR POSITIVE (*)    All other components within normal limits  CBC WITH DIFFERENTIAL/PLATELET - Abnormal; Notable for the following:    Hemoglobin 17.5 (*)    HCT 55.9 (*)    RDW 16.4 (*)    Monocytes Relative 13 (*)    All other components within normal limits  BASIC METABOLIC PANEL - Abnormal; Notable for the following:    Chloride 89 (*)    CO2 44 (*)    All other components within normal limits  TROPONIN I - Abnormal; Notable for the following:    Troponin I 0.05 (*)    All other components within normal limits  URINALYSIS, ROUTINE W REFLEX MICROSCOPIC - Abnormal; Notable for the following:    Color, Urine AMBER (*)    Hgb urine dipstick SMALL (*)    Protein, ur 100 (*)    All other components within normal limits  URINE MICROSCOPIC-ADD ON - Abnormal; Notable for the following:    Casts HYALINE CASTS (*)    All other components within normal limits  CBC - Abnormal; Notable for the following:    Hemoglobin 17.6 (*)    HCT 54.9 (*)    RDW 16.3 (*)    All other components within normal limits  TROPONIN I -  Abnormal; Notable for the following:    Troponin I 0.05 (*)    All other components within normal limits  TROPONIN I - Abnormal; Notable for the following:    Troponin I 0.04 (*)    All other components within normal limits  TROPONIN I - Abnormal; Notable for the following:    Troponin I 0.04 (*)    All other components within normal limits  BLOOD GAS, ARTERIAL -  Abnormal; Notable for the following:    pH, Arterial 7.249 (*)    pCO2 arterial 101.0 (*)    pO2, Arterial 177.0 (*)    Bicarbonate 42.7 (*)    Acid-Base Excess 15.0 (*)    All other components within normal limits  GLUCOSE, CAPILLARY - Abnormal; Notable for the following:    Glucose-Capillary 120 (*)    All other components within normal limits  GLUCOSE, CAPILLARY - Abnormal; Notable for the following:    Glucose-Capillary 100 (*)    All other components within normal limits  GLUCOSE, CAPILLARY - Abnormal; Notable for the following:    Glucose-Capillary 102 (*)    All other components within normal limits  I-STAT ARTERIAL BLOOD GAS, ED - Abnormal; Notable for the following:    pH, Arterial 7.325 (*)    pCO2 arterial 79.6 (*)    pO2, Arterial 61.0 (*)    Bicarbonate 41.5 (*)    Acid-Base Excess 10.0 (*)    All other components within normal limits  BRAIN NATRIURETIC PEPTIDE  APTT  PROTIME-INR  CREATININE, SERUM  BLOOD GAS, ARTERIAL  COMPREHENSIVE METABOLIC PANEL  CBC WITH DIFFERENTIAL/PLATELET    Imaging Review Dg Chest 2 View  08/19/2014   CLINICAL DATA:  Shortness of breath  EXAM: CHEST  2 VIEW  COMPARISON:  08/01/2013  FINDINGS: Mild cardiomegaly and congestive interstitial coarsening without overt edema. Stable aortic and hilar contours. No pneumonia, effusion, or pneumothorax.  IMPRESSION: Cardiomegaly and pulmonary venous congestion.   Electronically Signed   By: Marnee Spring M.D.   On: 08/19/2014 01:29   Ct Angio Chest Pe W/cm &/or Wo Cm  08/19/2014   CLINICAL DATA:  Unspecified chest pain.  EXAM: CT  ANGIOGRAPHY CHEST WITH CONTRAST  TECHNIQUE: Multidetector CT imaging of the chest was performed using the standard protocol during bolus administration of intravenous contrast. Multiplanar CT image reconstructions and MIPs were obtained to evaluate the vascular anatomy.  CONTRAST:  OMNIPAQUE IOHEXOL 350 MG/ML SOLN  COMPARISON:  None.  FINDINGS: THORACIC INLET/BODY WALL:  No acute abnormality.  MEDIASTINUM:  Normal heart size.  No pericardial effusion.  CTA of the pulmonary arteries is limited by patient size and intermittent motion, with the potential to miss segmental or smaller pulmonary embolism. There is no convincing pulmonary artery filling defect. No aortic dissection.  LUNG WINDOWS:  Mild atelectasis.  No consolidation or edema.  No pleural effusion.  UPPER ABDOMEN:  No acute findings.  OSSEOUS:  No acute fracture.  No suspicious lytic or blastic lesions.  Review of the MIP images confirms the above findings.  IMPRESSION: Limited study due to patient size; no evidence of pulmonary embolism.   Electronically Signed   By: Marnee Spring M.D.   On: 08/19/2014 03:03     EKG Interpretation   Date/Time:  Monday August 18 2014 23:16:05 EDT Ventricular Rate:  107 PR Interval:  148 QRS Duration: 82 QT Interval:  350 QTC Calculation: 467 R Axis:   133 Text Interpretation:  Sinus tachycardia Left posterior fascicular block  Abnormal QRS-T angle, consider primary T wave abnormality Abnormal ECG  s1q3t3 pattern seen Confirmed by Doniven Vanpatten, MD, Janey Genta [phone]) on 08/19/2014  12:43:00 AM      MDM   Final diagnoses:  Shortness of breath  Acute respiratory failure with hypoxia  Acute on Chronic Respiratory failure with hypercapnia  Pt with cc of dib. Noted to be hypoxic at arrival  - O2 sats 79% on room air, responded well to supplemental O2.  Pt has bilateral venous stasis, dressing changes 2 days ago, and legs appeared normal. He is morbidly obese and has diastolic CHF, no primary lung  pathology. He has OSA as well.  Exam doesn't show any evident of pulmonary edema. CXR shows mild congestion. PT has mild trop elevation and has elevated bicarb. ABG ordered. CPAP started as O2 sats drop with him sleeping. Initial bipap setting is 16/8. Given profound hypoxia, with no specific etiology, high risk for PE, and s1q3t3 pattern on ekg with tachycardia - CT PE was ordered (WELLS score is high), and is neg for PE. ABG shows profound hypercapnia. Still arousable and following command. Admit upgraded from stepdown to pulm icu. Pt's BIPAP changed to 18/6. He is pulling good tidal volumes.  CRITICAL CARE Performed by: Derwood Kaplan   Total critical care time: 65 min  Critical care time was exclusive of separately billable procedures and treating other patients.  Critical care was necessary to treat or prevent imminent or life-threatening deterioration.  Critical care was time spent personally by me on the following activities: development of treatment plan with patient and/or surrogate as well as nursing, discussions with consultants, evaluation of patient's response to treatment, examination of patient, obtaining history from patient or surrogate, ordering and performing treatments and interventions, ordering and review of laboratory studies, ordering and review of radiographic studies, pulse oximetry and re-evaluation of patient's condition.   Derwood Kaplan, MD 08/19/14 2227

## 2014-08-19 ENCOUNTER — Emergency Department (HOSPITAL_COMMUNITY): Payer: 59

## 2014-08-19 ENCOUNTER — Encounter (HOSPITAL_COMMUNITY): Payer: Self-pay

## 2014-08-19 DIAGNOSIS — G4733 Obstructive sleep apnea (adult) (pediatric): Secondary | ICD-10-CM | POA: Insufficient documentation

## 2014-08-19 DIAGNOSIS — G9340 Encephalopathy, unspecified: Secondary | ICD-10-CM | POA: Diagnosis present

## 2014-08-19 DIAGNOSIS — R7989 Other specified abnormal findings of blood chemistry: Secondary | ICD-10-CM

## 2014-08-19 DIAGNOSIS — L97929 Non-pressure chronic ulcer of unspecified part of left lower leg with unspecified severity: Secondary | ICD-10-CM | POA: Diagnosis present

## 2014-08-19 DIAGNOSIS — J9601 Acute respiratory failure with hypoxia: Secondary | ICD-10-CM | POA: Diagnosis present

## 2014-08-19 DIAGNOSIS — Z22322 Carrier or suspected carrier of Methicillin resistant Staphylococcus aureus: Secondary | ICD-10-CM | POA: Diagnosis not present

## 2014-08-19 DIAGNOSIS — I83009 Varicose veins of unspecified lower extremity with ulcer of unspecified site: Secondary | ICD-10-CM | POA: Diagnosis present

## 2014-08-19 DIAGNOSIS — Z79899 Other long term (current) drug therapy: Secondary | ICD-10-CM | POA: Diagnosis not present

## 2014-08-19 DIAGNOSIS — J309 Allergic rhinitis, unspecified: Secondary | ICD-10-CM | POA: Diagnosis present

## 2014-08-19 DIAGNOSIS — J9602 Acute respiratory failure with hypercapnia: Secondary | ICD-10-CM

## 2014-08-19 DIAGNOSIS — R778 Other specified abnormalities of plasma proteins: Secondary | ICD-10-CM | POA: Diagnosis present

## 2014-08-19 DIAGNOSIS — E662 Morbid (severe) obesity with alveolar hypoventilation: Secondary | ICD-10-CM | POA: Diagnosis present

## 2014-08-19 DIAGNOSIS — I10 Essential (primary) hypertension: Secondary | ICD-10-CM | POA: Diagnosis present

## 2014-08-19 DIAGNOSIS — I8311 Varicose veins of right lower extremity with inflammation: Secondary | ICD-10-CM | POA: Diagnosis not present

## 2014-08-19 DIAGNOSIS — R0602 Shortness of breath: Secondary | ICD-10-CM

## 2014-08-19 DIAGNOSIS — J9622 Acute and chronic respiratory failure with hypercapnia: Secondary | ICD-10-CM | POA: Diagnosis not present

## 2014-08-19 DIAGNOSIS — L97919 Non-pressure chronic ulcer of unspecified part of right lower leg with unspecified severity: Secondary | ICD-10-CM | POA: Diagnosis present

## 2014-08-19 DIAGNOSIS — I5032 Chronic diastolic (congestive) heart failure: Secondary | ICD-10-CM | POA: Diagnosis present

## 2014-08-19 DIAGNOSIS — J9600 Acute respiratory failure, unspecified whether with hypoxia or hypercapnia: Secondary | ICD-10-CM | POA: Diagnosis present

## 2014-08-19 DIAGNOSIS — E872 Acidosis: Secondary | ICD-10-CM | POA: Diagnosis present

## 2014-08-19 DIAGNOSIS — Z6841 Body Mass Index (BMI) 40.0 and over, adult: Secondary | ICD-10-CM | POA: Diagnosis not present

## 2014-08-19 DIAGNOSIS — J9621 Acute and chronic respiratory failure with hypoxia: Secondary | ICD-10-CM | POA: Diagnosis present

## 2014-08-19 LAB — I-STAT ARTERIAL BLOOD GAS, ED
Acid-Base Excess: 10 mmol/L — ABNORMAL HIGH (ref 0.0–2.0)
Bicarbonate: 41.5 mEq/L — ABNORMAL HIGH (ref 20.0–24.0)
O2 Saturation: 87 %
PO2 ART: 61 mmHg — AB (ref 80.0–100.0)
Patient temperature: 98.6
TCO2: 44 mmol/L (ref 0–100)
pCO2 arterial: 79.6 mmHg (ref 35.0–45.0)
pH, Arterial: 7.325 — ABNORMAL LOW (ref 7.350–7.450)

## 2014-08-19 LAB — URINALYSIS, ROUTINE W REFLEX MICROSCOPIC
Bilirubin Urine: NEGATIVE
Glucose, UA: NEGATIVE mg/dL
Ketones, ur: NEGATIVE mg/dL
LEUKOCYTES UA: NEGATIVE
NITRITE: NEGATIVE
PH: 5.5 (ref 5.0–8.0)
Protein, ur: 100 mg/dL — AB
SPECIFIC GRAVITY, URINE: 1.019 (ref 1.005–1.030)
Urobilinogen, UA: 0.2 mg/dL (ref 0.0–1.0)

## 2014-08-19 LAB — TROPONIN I
TROPONIN I: 0.04 ng/mL — AB (ref <0.031)
TROPONIN I: 0.05 ng/mL — AB (ref <0.031)
Troponin I: 0.05 ng/mL — ABNORMAL HIGH (ref <0.031)
Troponin I: 0.04 ng/mL — ABNORMAL HIGH (ref <0.031)

## 2014-08-19 LAB — BASIC METABOLIC PANEL
Anion gap: 6 (ref 5–15)
BUN: 12 mg/dL (ref 6–23)
CO2: 44 mmol/L (ref 19–32)
CREATININE: 0.97 mg/dL (ref 0.50–1.35)
Calcium: 8.8 mg/dL (ref 8.4–10.5)
Chloride: 89 mmol/L — ABNORMAL LOW (ref 96–112)
GFR calc Af Amer: >90 mL/min (ref >90)
GFR calc non Af Amer: >90 mL/min (ref >90)
Glucose, Bld: 97 mg/dL (ref 70–99)
POTASSIUM: 3.9 mmol/L (ref 3.5–5.1)
Sodium: 139 mmol/L (ref 135–145)

## 2014-08-19 LAB — CREATININE, SERUM
Creatinine, Ser: 0.87 mg/dL (ref 0.50–1.35)
GFR calc Af Amer: >90 mL/min (ref >90)
GFR calc non Af Amer: >90 mL/min (ref >90)

## 2014-08-19 LAB — CBC WITH DIFFERENTIAL/PLATELET
Basophils Absolute: 0 10*3/uL (ref 0.0–0.1)
Basophils Relative: 0 % (ref 0–1)
Eosinophils Absolute: 0.3 10*3/uL (ref 0.0–0.7)
Eosinophils Relative: 4 % (ref 0–5)
HEMATOCRIT: 55.9 % — AB (ref 39.0–52.0)
Hemoglobin: 17.5 g/dL — ABNORMAL HIGH (ref 13.0–17.0)
LYMPHS ABS: 1.5 10*3/uL (ref 0.7–4.0)
LYMPHS PCT: 21 % (ref 12–46)
MCH: 30.9 pg (ref 26.0–34.0)
MCHC: 31.3 g/dL (ref 30.0–36.0)
MCV: 98.8 fL (ref 78.0–100.0)
Monocytes Absolute: 0.9 10*3/uL (ref 0.1–1.0)
Monocytes Relative: 13 % — ABNORMAL HIGH (ref 3–12)
NEUTROS PCT: 63 % (ref 43–77)
Neutro Abs: 4.4 10*3/uL (ref 1.7–7.7)
Platelets: 181 10*3/uL (ref 150–400)
RBC: 5.66 MIL/uL (ref 4.22–5.81)
RDW: 16.4 % — ABNORMAL HIGH (ref 11.5–15.5)
WBC: 7.1 10*3/uL (ref 4.0–10.5)

## 2014-08-19 LAB — BLOOD GAS, ARTERIAL
Acid-Base Excess: 15 mmol/L — ABNORMAL HIGH (ref 0.0–2.0)
Bicarbonate: 42.7 mEq/L — ABNORMAL HIGH (ref 20.0–24.0)
Delivery systems: POSITIVE
Drawn by: 398661
Expiratory PAP: 6
FIO2: 0.6 %
Inspiratory PAP: 16
O2 Saturation: 98.5 %
PCO2 ART: 101 mmHg — AB (ref 35.0–45.0)
Patient temperature: 98.3
TCO2: 45.8 mmol/L (ref 0–100)
pH, Arterial: 7.249 — ABNORMAL LOW (ref 7.350–7.450)
pO2, Arterial: 177 mmHg — ABNORMAL HIGH (ref 80.0–100.0)

## 2014-08-19 LAB — CBC
HEMATOCRIT: 54.9 % — AB (ref 39.0–52.0)
Hemoglobin: 17.6 g/dL — ABNORMAL HIGH (ref 13.0–17.0)
MCH: 31.7 pg (ref 26.0–34.0)
MCHC: 32.1 g/dL (ref 30.0–36.0)
MCV: 98.7 fL (ref 78.0–100.0)
Platelets: 182 10*3/uL (ref 150–400)
RBC: 5.56 MIL/uL (ref 4.22–5.81)
RDW: 16.3 % — AB (ref 11.5–15.5)
WBC: 7 10*3/uL (ref 4.0–10.5)

## 2014-08-19 LAB — URINE MICROSCOPIC-ADD ON

## 2014-08-19 LAB — BRAIN NATRIURETIC PEPTIDE: B Natriuretic Peptide: 72 pg/mL (ref 0.0–100.0)

## 2014-08-19 LAB — MRSA PCR SCREENING: MRSA by PCR: POSITIVE — AB

## 2014-08-19 LAB — GLUCOSE, CAPILLARY
GLUCOSE-CAPILLARY: 120 mg/dL — AB (ref 70–99)
GLUCOSE-CAPILLARY: 100 mg/dL — AB (ref 70–99)
GLUCOSE-CAPILLARY: 102 mg/dL — AB (ref 70–99)

## 2014-08-19 LAB — PROTIME-INR
INR: 1.1 (ref 0.00–1.49)
Prothrombin Time: 14.4 seconds (ref 11.6–15.2)

## 2014-08-19 LAB — APTT: APTT: 32 s (ref 24–37)

## 2014-08-19 MED ORDER — CHLORHEXIDINE GLUCONATE 0.12 % MT SOLN
15.0000 mL | Freq: Two times a day (BID) | OROMUCOSAL | Status: DC
  Administered 2014-08-19 – 2014-08-22 (×6): 15 mL via OROMUCOSAL
  Filled 2014-08-19 (×5): qty 15

## 2014-08-19 MED ORDER — LIDOCAINE HCL (CARDIAC) 20 MG/ML IV SOLN
INTRAVENOUS | Status: AC
  Filled 2014-08-19: qty 5

## 2014-08-19 MED ORDER — MUPIROCIN 2 % EX OINT
1.0000 "application " | TOPICAL_OINTMENT | Freq: Two times a day (BID) | CUTANEOUS | Status: DC
  Administered 2014-08-19 – 2014-08-22 (×7): 1 via NASAL
  Filled 2014-08-19 (×2): qty 22

## 2014-08-19 MED ORDER — IOHEXOL 350 MG/ML SOLN
100.0000 mL | Freq: Once | INTRAVENOUS | Status: AC | PRN
  Administered 2014-08-19: 100 mL via INTRAVENOUS

## 2014-08-19 MED ORDER — ASPIRIN EC 325 MG PO TBEC
325.0000 mg | DELAYED_RELEASE_TABLET | Freq: Every day | ORAL | Status: DC
  Administered 2014-08-19 – 2014-08-22 (×4): 325 mg via ORAL
  Filled 2014-08-19 (×4): qty 1

## 2014-08-19 MED ORDER — SUCCINYLCHOLINE CHLORIDE 20 MG/ML IJ SOLN
INTRAMUSCULAR | Status: AC
  Filled 2014-08-19: qty 1

## 2014-08-19 MED ORDER — HEPARIN SODIUM (PORCINE) 5000 UNIT/ML IJ SOLN
5000.0000 [IU] | Freq: Three times a day (TID) | INTRAMUSCULAR | Status: DC
  Administered 2014-08-19 – 2014-08-22 (×9): 5000 [IU] via SUBCUTANEOUS
  Filled 2014-08-19 (×12): qty 1

## 2014-08-19 MED ORDER — ETOMIDATE 2 MG/ML IV SOLN
INTRAVENOUS | Status: AC
  Filled 2014-08-19: qty 20

## 2014-08-19 MED ORDER — TORSEMIDE 20 MG PO TABS
20.0000 mg | ORAL_TABLET | Freq: Every day | ORAL | Status: DC
  Administered 2014-08-19 – 2014-08-22 (×4): 20 mg via ORAL
  Filled 2014-08-19 (×5): qty 1

## 2014-08-19 MED ORDER — ROCURONIUM BROMIDE 50 MG/5ML IV SOLN
INTRAVENOUS | Status: AC
  Filled 2014-08-19: qty 2

## 2014-08-19 MED ORDER — CHLORHEXIDINE GLUCONATE CLOTH 2 % EX PADS
6.0000 | MEDICATED_PAD | Freq: Every day | CUTANEOUS | Status: DC
  Administered 2014-08-20 – 2014-08-22 (×3): 6 via TOPICAL

## 2014-08-19 MED ORDER — SODIUM CHLORIDE 0.9 % IJ SOLN
3.0000 mL | Freq: Two times a day (BID) | INTRAMUSCULAR | Status: DC
  Administered 2014-08-19 – 2014-08-22 (×7): 3 mL via INTRAVENOUS

## 2014-08-19 MED ORDER — CETYLPYRIDINIUM CHLORIDE 0.05 % MT LIQD
7.0000 mL | Freq: Two times a day (BID) | OROMUCOSAL | Status: DC
  Administered 2014-08-19 – 2014-08-21 (×6): 7 mL via OROMUCOSAL

## 2014-08-19 MED ORDER — HEPARIN SODIUM (PORCINE) 5000 UNIT/ML IJ SOLN: 5000.0000 [IU] | Freq: Three times a day (TID) | INTRAMUSCULAR | Status: DC

## 2014-08-19 NOTE — ED Notes (Signed)
Admitting MD at BS.  

## 2014-08-19 NOTE — H&P (Addendum)
Hospitalist Admission History and Physical  Patient name: Tony Mccullough Medical record number: [id number] Date of birth: Mar 12, 1981 Age: 34 y.o. Gender: male  Primary Care Provider: Sanda Linger, MD  Chief Complaint: encephalopathy, acute resp failure, chest pain, elevated trop   History of Present Illness:This is a 34 y.o. year old male with significant past medical history of morbid obesity, chronic resp failure/OHS-not on O2, OSA on CPAP,  CHF, HTN presenting with cephalopathy, acute on chronic respiratory failure, chest pain, elevated troponin. Level V caveat as patient is acutely encephalopathic on BiPAP. Per report, patient with progressively worsening chest pain and shortness of breath. Per report, this is been a chronic issue. Presented to the ER because of worsening symptoms. Had worsening dyspnea on exertion, which is off patient's baseline. Presented to ER afebrile, heart rate into the 130s, respirations in the tens to 20s, blood pressure in the 100s to 150s, satting in the mid to low 80s on room air-improved with supplemental oxygen placement to the mid 90s. Blood cell count 7.0, hemoglobin 17.5, creatinine 0.97, bicarbonate 44. INR 1.1, troponin 0.05. Urinalysis is not indicative of infection. EKG sinus tachycardia with? S1Q3T3 pattern. Follow-up CT angiogram negative for PE. Pt placed on BiPAP at bedside.   Assessment and Plan: [name] is a 34 y.o. year old male presenting with encephalopathy, acute resp failure, chest pain, elevated trop   Active Problems:   Acute respiratory failure with hypoxia   Elevated troponin   1- Acute resp failure w/ hypoxia -Likely multifactorial in setting of baseline morbid obesity, OHS, OSA,  -noted elevated bicarb on presentation @ 44-? Decompensated resp acidosis -noted hx/o resp failure s/p trach -check ABG, though pt currently on BiPAP -CTAngio negative for PE -LE u/s  -no infiltrate on imaging -2d ECHO -cont BiPAP pending  ABG -follow  2-Elevated trop -likely secondary to above -noted report of CP on presentation -limited history as pt is somnolent on exam -noted ? S1Q3T3 pattern on EKG -trop minimally/mildly elevated -cycle CEs  -full dose ASA -I have asked EDP to consult cardiology  3-Encephalopathy -likely multifactorial in setting of above -ammonia level  -ABG -TSH  -no signs of infection at present  -follow  4-CHF -2D ECHO 11/2013 EF 60-65%-study technically insufficient to assess diastolic function 2/2 body habitus -repeat 2 D ECHO -BNP  -noted chronic LE swelling -consider aggressive diuresis pending BNP -I have asked EDP to formally consult cardiology    FEN/GI: NPO  Prophylaxis: sub q heparin Disposition: pending further evaluation  Code Status:Full Code   CLINICAL UPDATE: NOTED CO2 >100 ON ABG. EDP NANAVATI REVIEWED. PT FELT MORE APPROPRIATE FOR PCCM. HE WILL FORMALLY CONSULT. Patient Active Problem List   Diagnosis Date Noted  . Acute respiratory failure 08/19/2014  . Hyperglycemia 05/27/2014  . Obesity hypoventilation syndrome 11/26/2013  . Edema 11/26/2013  . Routine general medical examination at a health care facility 10/08/2013  . Essential hypertension, benign 10/08/2013  . Venous stasis ulcers of both lower extremities 10/08/2013  . Obstructive sleep apnea 04/08/2010  . Chronic respiratory failure 10/28/2008  . Morbid obesity 10/27/2008   Past Medical History: Past Medical History  Diagnosis Date  . Hypertension   . Respiratory failure 09/02/2008    s/p pneumonia, on ventilator and treacheotomy  . Wears glasses   . Venous stasis dermatitis   . Morbid obesity   . OSA (obstructive sleep apnea)   . CHF (congestive heart failure) 09/02/2008  . Elephantiasis   . Lymphedema of lower extremity  chronic, legs  . Enlarged heart     Past Surgical History: Past Surgical History  Procedure Laterality Date  . Tracheostomy      Social History: History    Social History  . Marital Status: Single    Spouse Name: n/a  . Number of Children: 0  . Years of Education: 12+   Occupational History  . forklift driver [employer]   Social History Main Topics  . Smoking status: Never Smoker   . Smokeless tobacco: Never Used  . Alcohol Use: Yes     Comment: "once in a blue moon."  . Drug Use: No  . Sexual Activity: Not on file   Other Topics Concern  . None   Social History Narrative   Lives with his mother.    Family History: Family History  Problem Relation Age of Onset  . Hypertension    . Heart failure    . Diabetes Mother   . Heart disease Father   . Hyperlipidemia Father   . Cancer Neg Hx   . Alcohol abuse Neg Hx   . COPD Neg Hx   . Depression Neg Hx   . Drug abuse Neg Hx   . Early death Neg Hx   . Hearing loss Neg Hx   . Learning disabilities Neg Hx   . Stroke Neg Hx     Allergies: No Known Allergies  Current Facility-Administered Medications  Medication Dose Route Frequency Provider Last Rate Last Dose  . aspirin EC tablet 325 mg  325 mg Oral Daily Floydene FlockSteven J Monnie Gudgel, MD      . heparin injection 5,000 Units  5,000 Units Subcutaneous 3 times per day Floydene FlockSteven J Rainie Crenshaw, MD      . sodium chloride 0.9 % injection 3 mL  3 mL Intravenous Q12H Floydene FlockSteven J Cache Bills, MD       Current Outpatient Prescriptions  Medication Sig Dispense Refill  . torsemide (DEMADEX) 20 MG tablet Take 1 tablet (20 mg total) by mouth daily. 30 tablet 5  . Azilsartan Medoxomil (EDARBI) 40 MG TABS Take 1 tablet by mouth daily. (Patient not taking: Reported on 08/19/2014) 30 tablet 11   Review Of Systems: 12 point ROS negative except as noted above in HPI.  Physical Exam: Filed Vitals:   08/19/14 0445  BP: 155/92  Pulse: 88  Temp:   Resp: 24    General: encephalopathic, super obese HEENT: PERRLA and extra ocular movement intact Heart: S1, S2 normal, no murmur, rub or gallop, regular rate and rhythm Lungs: decreased breath sounds  diffusely Abdomen: abdomen is soft without significant tenderness, masses, organomegaly or guarding Extremities: venous stasis changes, 2-3 + edema bilaterally, distal pulses intact Skin:no rashes Neurology: somnolent, minimally/mildly responsive to sternal rub  Labs and Imaging: Lab Results  Component Value Date/Time   NA 139 08/18/2014 11:55 PM   K 3.9 08/18/2014 11:55 PM   CL 89* 08/18/2014 11:55 PM   CO2 44* 08/18/2014 11:55 PM   BUN 12 08/18/2014 11:55 PM   CREATININE 0.97 08/18/2014 11:55 PM   GLUCOSE 97 08/18/2014 11:55 PM   Lab Results  Component Value Date   WBC 7.1 08/18/2014   HGB 17.5* 08/18/2014   HCT 55.9* 08/18/2014   MCV 98.8 08/18/2014   PLT 181 08/18/2014   Urinalysis    Component Value Date/Time   COLORURINE AMBER* 08/18/2014 2349   APPEARANCEUR CLEAR 08/18/2014 2349   LABSPEC 1.019 08/18/2014 2349   PHURINE 5.5 08/18/2014 2349   GLUCOSEU NEGATIVE  08/18/2014 2349   GLUCOSEU NEGATIVE 05/27/2014 1124   HGBUR SMALL* 08/18/2014 2349   BILIRUBINUR NEGATIVE 08/18/2014 2349   KETONESUR NEGATIVE 08/18/2014 2349   PROTEINUR 100* 08/18/2014 2349   UROBILINOGEN 0.2 08/18/2014 2349   NITRITE NEGATIVE 08/18/2014 2349   LEUKOCYTESUR NEGATIVE 08/18/2014 2349       Dg Chest 2 View  08/19/2014   CLINICAL DATA:  Shortness of breath  EXAM: CHEST  2 VIEW  COMPARISON:  08/01/2013  FINDINGS: Mild cardiomegaly and congestive interstitial coarsening without overt edema. Stable aortic and hilar contours. No pneumonia, effusion, or pneumothorax.  IMPRESSION: Cardiomegaly and pulmonary venous congestion.   Electronically Signed   By: Marnee Spring M.D.   On: 08/19/2014 01:29   Ct Angio Chest Pe W/cm &/or Wo Cm  08/19/2014   CLINICAL DATA:  Unspecified chest pain.  EXAM: CT ANGIOGRAPHY CHEST WITH CONTRAST  TECHNIQUE: Multidetector CT imaging of the chest was performed using the standard protocol during bolus administration of intravenous contrast. Multiplanar CT image  reconstructions and MIPs were obtained to evaluate the vascular anatomy.  CONTRAST:  OMNIPAQUE IOHEXOL 350 MG/ML SOLN  COMPARISON:  None.  FINDINGS: THORACIC INLET/BODY WALL:  No acute abnormality.  MEDIASTINUM:  Normal heart size.  No pericardial effusion.  CTA of the pulmonary arteries is limited by patient size and intermittent motion, with the potential to miss segmental or smaller pulmonary embolism. There is no convincing pulmonary artery filling defect. No aortic dissection.  LUNG WINDOWS:  Mild atelectasis.  No consolidation or edema.  No pleural effusion.  UPPER ABDOMEN:  No acute findings.  OSSEOUS:  No acute fracture.  No suspicious lytic or blastic lesions.  Review of the MIP images confirms the above findings.  IMPRESSION: Limited study due to patient size; no evidence of pulmonary embolism.   Electronically Signed   By: Marnee Spring M.D.   On: 08/19/2014 03:03           Doree Albee MD  Pager: [phone]

## 2014-08-19 NOTE — Consult Note (Addendum)
WOC wound consult note Una boots needed to be removed since pt is scheduled to have a lower extremity venous duplex study bilat today to r/o DVT.  Removed bilat Una boots and assessed legs.  Left posterior leg with healing full thickness wound; 4X4X.2cm, red and moist, mod amt yellow drainage, no odor.  White macerated skin surrounding.  Right posterior leg with same appearance; 2X2X.2cm,  red and moist, mod amt yellow drainage, no odor.  White macerated skin surrounding. Applied Aquacel over wounds to absorb drainage and promote healing; ortho tech can be paged to re-apply The PepsiUna boots and coban after vascular study is completed and change Q Tuesday. Pt can resume follow-up with outpatient wound care center after discharge. Discussed plan of care with patient and he verbalizes understanding. Please re-consult if further assistance is needed.  Thank-you,  Cammie Mcgeeawn Zoraya Fiorenza MSN, RN, CWOCN, [place], CNS [phone]

## 2014-08-19 NOTE — Consult Note (Addendum)
WOC wound consult note Reason for Consult: Consult requested for bilat legs.  Pt is very well-informed regarding wounds and topical treatment.  He states he has a few full thickness chronic wounds to his legs and is followed by the outpatient wound care center.  They recently assessed all wounds and applied Hydroferra blue and bilat Una boots on Friday.  There is no odor or drainage through current wraps. Dressing procedure/placement/frequency: Neomia DearUna boots are due to be changed on FRI.  [facility]ferra blue [facility]; WOC will plan to substitute Aquacel for the dressing when compression wraps are changed.  Discussed plan of care with pt and he verbalizes understanding. Cammie Mcgeeawn Autym Siess MSN, RN, CWOCN, [place], CNS [phone]
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
## 2014-08-19 NOTE — Progress Notes (Signed)
VASCULAR LAB PRELIMINARY  PRELIMINARY  PRELIMINARY  PRELIMINARY  Bilateral lower extremity venous duplex  completed.    Preliminary report:  Bilateral:  No evidence of DVT, superficial thrombosis, or Baker's Cyst.  Technically limited by body habitus and thick skin.    Keaton Beichner, RVT 08/19/2014, 4:01 PM

## 2014-08-19 NOTE — ED Notes (Signed)
Floor notified re: delay of transporting pt

## 2014-08-19 NOTE — Progress Notes (Signed)
Orthopedic Tech Progress Note Patient Details:  Tony GuestWilliam D Dasher 06/30/1980 [id number]  Ortho Devices Type of Ortho Device: Roland RackUnna boot Ortho Device/Splint Location: (B) LE Ortho Device/Splint Interventions: Ordered, Application   Jennye MoccasinHughes, Jaiden Wahab Craig 08/19/2014, 5:03 PM

## 2014-08-19 NOTE — ED Notes (Signed)
35M is aware of delay in tranpsportation.

## 2014-08-19 NOTE — H&P (Signed)
PULMONARY  / CRITICAL CARE MEDICINE  Name: CAM DAUPHIN MRN: [id number] DOB: [birth year]    LOS: 0  REFERRING MD :  Dr. Alvester Morin, [facility]  CHIEF COMPLAINT:  Shortness of Breath  BRIEF PATIENT DESCRIPTION: Mr. Hightower is a 34 yo male with PMHx of morbid obesity, HTN, h/o respiratory failure s/p pneumonia and s/p tracheostomy in 2009, severe OSA, lymphedema and venous stasis and CHF who presented to [facility] ED with complaints of shortness of breath.   LINES / TUBES: PIV  CULTURES: None  ANTIBIOTICS: None  SIGNIFICANT EVENTS:  4/12>> Admitted to MICU  LEVEL OF CARE:  ICU PRIMARY SERVICE:  PCCM CONSULTANTS:  None CODE STATUS: FULL DIET:  NPO DVT Px: Heparin TID SQ  HISTORY OF PRESENT ILLNESS:  Mr. Kenley is a 34 yo male with PMHx of morbid obesity, HTN, h/o respiratory failure s/p pneumonia and s/p tracheostomy in 2009, severe OSA, lymphedema and venous stasis and CHF who presented to [facility] ED with complaints of shortness of breath for the past 2 days. Shortness of breath is worse with exertion. He also admits to productive cough of thick white sputum for one week. He denies any fever, chills, chest pain, nausea, vomiting. He states he has been compliant with his medications and only missed a dose one week ago. On presentation, patient was afebrile, tachycardic into the 130s, tachypneic in the 20s, normotensive, satting 79% on room air initially and then in the mid to low 80s on room air. No leukocytosis 7.0, hemoglobin 17.5, creatinine 0.97, bicarbonate 44. INR 1.1, troponin 0.05. Urinalysis is not indicative of infection. EKG sinus tachycardia. CT angiogram negative for PE. Pt placed on BiPAP at bedside as patient was persistently de-satting likely secondary to severe OSA and morbid obesity. PCO2 101 after being on BiPAP for 2 hours.   PAST MEDICAL HISTORY :  Past Medical History  Diagnosis Date  . Hypertension   . Respiratory failure 09/02/2008    s/p pneumonia, on ventilator and treacheotomy  .  Wears glasses   . Venous stasis dermatitis   . Morbid obesity   . OSA (obstructive sleep apnea)   . CHF (congestive heart failure) 09/02/2008  . Elephantiasis   . Lymphedema of lower extremity     chronic, legs  . Enlarged heart    Past Surgical History  Procedure Laterality Date  . Tracheostomy     Prior to Admission medications   Medication Sig Start Date End Date Taking? Authorizing Provider  torsemide (DEMADEX) 20 MG tablet Take 1 tablet (20 mg total) by mouth daily. 05/27/14  Yes Etta Grandchild, MD  Azilsartan Medoxomil (EDARBI) 40 MG TABS Take 1 tablet by mouth daily. Patient not taking: Reported on 08/19/2014 08/07/14   Etta Grandchild, MD   No Known Allergies  FAMILY HISTORY:  Family History  Problem Relation Age of Onset  . Hypertension    . Heart failure    . Diabetes Mother   . Heart disease Father   . Hyperlipidemia Father   . Cancer Neg Hx   . Alcohol abuse Neg Hx   . COPD Neg Hx   . Depression Neg Hx   . Drug abuse Neg Hx   . Early death Neg Hx   . Hearing loss Neg Hx   . Learning disabilities Neg Hx   . Stroke Neg Hx    SOCIAL HISTORY:  reports that he has never smoked. He has never used smokeless tobacco. He reports that he drinks alcohol. He reports  that he does not use illicit drugs.  REVIEW OF SYSTEMS:   General: Denies fever, chills, fatigue Respiratory: Admits to SOB, DOE, productive cough. Denies chest tightness, and wheezing.   Cardiovascular: Denies chest pain and palpitations.  Gastrointestinal: Denies nausea, vomiting, abdominal pain, diarrhea Genitourinary: Denies dysuria, urgency, frequency Skin: Chronic lower extremity venous stasis changes bilaterally Neurological: Denies dizziness, headaches, weakness  VITAL SIGNS: Temp:  [98.3 F (36.8 C)-98.5 F (36.9 C)] 98.3 F (36.8 C) (04/11 2340) Pulse Rate:  [84-138] 87 (04/12 0700) Resp:  [10-27] 21 (04/12 0700) BP: (109-156)/(52-96) 139/67 mmHg (04/12 0700) SpO2:  [79 %-98 %] 95 % (04/12  0700) FiO2 (%):  [55 %] 55 % (04/12 0132) Weight:  [455 lb 6 oz (206.557 kg)] 455 lb 6 oz (206.557 kg) (04/11 2321)  VENTILATOR SETTINGS: Vent Mode:  [-]  FiO2 (%):  [55 %] 55 % INTAKE / OUTPUT: Intake/Output      04/11 0701 - 04/12 0700 04/12 0701 - 04/13 0700   Urine (mL/kg/hr) 200    Total Output 200     Net -200           PHYSICAL EXAMINATION: General: Vital signs reviewed.  Patient is morbidly obese, in no acute distress and cooperative with exam.  Neck: Obese, unable to assess JVD. Well-healed tracheostomy. Cardiovascular: Tachycardic, regular rhythm Pulmonary/Chest: Decreased breath sounds, difficult to auscultate given body habitus. Clear to auscultation bilaterally, no wheezes, rales, or rhonchi. Abdominal: Soft, non-tender, non-distended, BS + Extremities: Chronic lower extremity venous stasis changes bilaterally well wrapped Neurological: A&O x3 Psychiatric: Normal mood and affect. speech and behavior is normal. Cognition and memory are normal.   LABS: Cbc  Recent Labs Lab 08/18/14 2355 08/19/14 0520  WBC 7.1 7.0  HGB 17.5* 17.6*  HCT 55.9* 54.9*  PLT 181 182    Chemistry  Recent Labs Lab 08/18/14 2355 08/19/14 0520  NA 139  --   K 3.9  --   CL 89*  --   CO2 44*  --   BUN 12  --   CREATININE 0.97 0.87  CALCIUM 8.8  --   GLUCOSE 97  --    coags  Recent Labs Lab 08/18/14 2355  APTT 32  INR 1.10   Cardiac markers  Recent Labs Lab 08/18/14 2355 08/19/14 0520  TROPONINI 0.05* 0.05*   BNP 72  ABG  Recent Labs Lab 08/19/14 0527  PHART 7.249*  PCO2ART 101.0*  PO2ART 177.0*  HCO3 42.7*  TCO2 45.8   IMAGING: CXR 4/12>> Cardiomegaly, pulmonary vascular congestion CT Angiogram 4/12>> Limited due to patient size, no evidence of PE  ECG: 4/12>> sinus tachycardia, 107  DIAGNOSES: Active Problems:   Obesity hypoventilation syndrome   Acute respiratory failure with hypoxia   Elevated troponin   Acute respiratory  failure  ASSESSMENT / PLAN:  PULMONARY  ASSESSMENT: Acute Respiratory Failure with Hypoxia-requiring BiPAP, Lungs clear, no evidence of PE on CTA, volume overload on CXR. One week history of productive cough, possibly viral bronchitis as no infiltrate seen on CXR. Multifactorial in setting of baseline morbid obesity, OHS, OSA.  Chronic Respiratory Acidosis: pH 7.249, pCO2 101, pO2 177, bicarb 42.7, O2 sats 98.5 after bipap for 2 hours.  H/o of tracheostomy-well healed OSA OHS PLAN:   Admit to ICU LE ultrasound 2D Echo Continue BiPAP  ABG Torsemide 20 mg daily for volume overload Repeat CXR tomorrow am  CARDIOVASCULAR  ASSESSMENT:  Mild elevated troponin 0.05: Likely secondary to above, no chest pain.  EKG sinus  tachycardia CHF: pro-BNP low normal, unreliable in morbid obesity. 2D ECHO 11/2013 EF 60-65%-study technically insufficient to assess diastolic function 2/2 body habitus. Unable to assess LEE given chronic edema secondary to venous stasis.  PLAN:  Cycle troponins Given ASA 325 mg daily Repeat 2D ECHO LE dopplers Torsemide 20 mg daily   [facility] asked EDP to formally consult cardiology  Strict I/Os, daily weights  RENAL  ASSESSMENT:   Normal RF: BUN/Cr 12/0.97 PLAN:   Repeat BMET tomorrow am Strict I/Os  GASTROINTESTINAL  ASSESSMENT:   No active issues PLAN:   NPO while on BiPAP Consider nutrition consult for weight loss assistance  HEMATOLOGIC  ASSESSMENT:   No active issues PT/PTT/INR 14.4/32/1.10 PLAN:  Heparin SQ TID Repeat CBC tomorrow am  INFECTIOUS  ASSESSMENT:  Afebrile, no leukocytosis. CXR without signs of infiltrate, although patient complains of one week history of productive cough. Urinalysis negative for infection. History of chronic venous statis, follows with wound care clinic. Legs well wrapped.  PLAN:   Wound care consult   ENDOCRINE  ASSESSMENT:   HgbA1c 6.4 on 08/07/14 TSH 2.92 on 05/27/14 PLAN:    TSH  NEUROLOGIC  ASSESSMENT:   Somnolent on exam, but normal mental status AAOx3 PLAN:   Monitor [facility] has ordered TSH, ammonia level  CLINICAL SUMMARY: [name] is a 34 yo male with PMHx of morbid obesity, HTN, h/o respiratory failure s/p pneumonia and s/p tracheostomy in 2009, severe OSA, lymphedema and venous stasis and CHF who [facility] ED with complaints of shortness of breath. Patient was found to be in acute respiratory failure with hypoxia with acute on chronic respiratory acidosis. Likely multifactorial secondary to pulmonary edema, OSA, OHS, ?viral bronchitis, and morbid obesity. Patient is being managed with torsemide and BiPAP with further work up in process.   Jill AlexandersAlexa Richardson, DO PGY-1 Internal Medicine Resident Pager # [phone] 08/19/2014 7:20 AM

## 2014-08-20 ENCOUNTER — Inpatient Hospital Stay (HOSPITAL_COMMUNITY): Payer: 59

## 2014-08-20 DIAGNOSIS — J9622 Acute and chronic respiratory failure with hypercapnia: Secondary | ICD-10-CM

## 2014-08-20 LAB — COMPREHENSIVE METABOLIC PANEL
ALT: 47 U/L (ref 0–53)
AST: 47 U/L — ABNORMAL HIGH (ref 0–37)
Albumin: 3 g/dL — ABNORMAL LOW (ref 3.5–5.2)
Alkaline Phosphatase: 70 U/L (ref 39–117)
Anion gap: 9 (ref 5–15)
BUN: 10 mg/dL (ref 6–23)
CALCIUM: 8.4 mg/dL (ref 8.4–10.5)
CO2: 42 mmol/L (ref 19–32)
CREATININE: 0.85 mg/dL (ref 0.50–1.35)
Chloride: 86 mmol/L — ABNORMAL LOW (ref 96–112)
GFR calc Af Amer: >90 mL/min (ref >90)
GFR calc non Af Amer: >90 mL/min (ref >90)
GLUCOSE: 93 mg/dL (ref 70–99)
Potassium: 3.8 mmol/L (ref 3.5–5.1)
Sodium: 137 mmol/L (ref 135–145)
Total Bilirubin: 0.9 mg/dL (ref 0.3–1.2)
Total Protein: 8 g/dL (ref 6.0–8.3)

## 2014-08-20 LAB — CBC WITH DIFFERENTIAL/PLATELET
BASOS PCT: 0 % (ref 0–1)
Basophils Absolute: 0 10*3/uL (ref 0.0–0.1)
EOS ABS: 0.3 10*3/uL (ref 0.0–0.7)
Eosinophils Relative: 5 % (ref 0–5)
HCT: 55.6 % — ABNORMAL HIGH (ref 39.0–52.0)
Hemoglobin: 17.1 g/dL — ABNORMAL HIGH (ref 13.0–17.0)
Lymphocytes Relative: 18 % (ref 12–46)
Lymphs Abs: 1.1 10*3/uL (ref 0.7–4.0)
MCH: 30.6 pg (ref 26.0–34.0)
MCHC: 30.8 g/dL (ref 30.0–36.0)
MCV: 99.5 fL (ref 78.0–100.0)
MONO ABS: 0.9 10*3/uL (ref 0.1–1.0)
Monocytes Relative: 14 % — ABNORMAL HIGH (ref 3–12)
NEUTROS PCT: 63 % (ref 43–77)
Neutro Abs: 3.8 10*3/uL (ref 1.7–7.7)
Platelets: 142 10*3/uL — ABNORMAL LOW (ref 150–400)
RBC: 5.59 MIL/uL (ref 4.22–5.81)
RDW: 16.3 % — ABNORMAL HIGH (ref 11.5–15.5)
WBC: 6.1 10*3/uL (ref 4.0–10.5)

## 2014-08-20 MED ORDER — FLUTICASONE PROPIONATE 50 MCG/ACT NA SUSP
2.0000 | Freq: Every day | NASAL | Status: DC
  Administered 2014-08-20 – 2014-08-22 (×3): 2 via NASAL
  Filled 2014-08-20: qty 16

## 2014-08-20 MED ORDER — LORATADINE 10 MG PO TABS
10.0000 mg | ORAL_TABLET | Freq: Every day | ORAL | Status: DC
  Administered 2014-08-20 – 2014-08-22 (×3): 10 mg via ORAL
  Filled 2014-08-20 (×3): qty 1

## 2014-08-20 NOTE — Progress Notes (Signed)
PULMONARY  / CRITICAL CARE MEDICINE  Name: Tony Mccullough MRN: [id number] DOB: 01/21/1981    LOS: 1  REFERRING MD :  Dr. Alvester MorinNewton, [facility]  CHIEF COMPLAINT:  Shortness of Breath  BRIEF PATIENT DESCRIPTION: Mr. Tony Mccullough is a 34 yo male with PMHx of morbid obesity, HTN, h/o respiratory failure s/p pneumonia and s/p tracheostomy in 2009, severe OSA, lymphedema and venous stasis and CHF who presented to [facility] with complaints of shortness of breath.   LINES / TUBES: PIV  CULTURES: None  ANTIBIOTICS: None  SIGNIFICANT EVENTS:  4/12>> Admitted to MICU  LEVEL OF CARE:  ICU PRIMARY SERVICE:  PCCM CONSULTANTS:  None CODE STATUS: FULL DIET:  NPO DVT Px: Heparin TID SQ  HISTORY OF PRESENT ILLNESS:  Mr. Tony Mccullough is a 34 yo male with PMHx of morbid obesity, HTN, h/o respiratory failure s/p pneumonia and s/p tracheostomy in 2009, severe OSA, lymphedema and venous stasis and CHF who presented to [facility] with complaints of shortness of breath for the past 2 days. Shortness of breath is worse with exertion. He also admits to productive cough of thick white sputum for one week. He denies any fever, chills, chest pain, nausea, vomiting. He states he has been compliant with his medications and only missed a dose one week ago. On presentation, patient was afebrile, tachycardic into the 130s, tachypneic in the 20s, normotensive, satting 79% on room air initially and then in the mid to low 80s on room air. No leukocytosis 7.0, hemoglobin 17.5, creatinine 0.97, bicarbonate 44. INR 1.1, troponin 0.05. Urinalysis is not indicative of infection. EKG sinus tachycardia. CT angiogram negative for PE. Pt placed on BiPAP at bedside as patient was persistently de-satting likely secondary to severe OSA and morbid obesity. PCO2 101 after being on BiPAP for 2 hours.   Subjective: Pt feels well this AM. Just some nasal congestion has stopped taking allergy meds recently. Wants to eat. No cp, sob, fever/chills, abdominal pain.   VITAL  SIGNS: Temp:  [97.4 F (36.3 C)-97.9 F (36.6 C)] 97.9 F (36.6 C) (04/13 0414) Pulse Rate:  [77-95] 77 (04/13 0600) Resp:  [8-30] 29 (04/13 0600) BP: (97-163)/(60-93) 139/68 mmHg (04/13 0600) SpO2:  [87 %-100 %] 96 % (04/13 0600) FiO2 (%):  [40 %-55 %] 50 % (04/13 0600) Weight:  [455 lb (206.387 kg)] 455 lb (206.387 kg) (04/12 1000)  VENTILATOR SETTINGS: Vent Mode:  [-]  FiO2 (%):  [40 %-55 %] 50 % INTAKE / OUTPUT: Intake/Output      04/12 0701 - 04/13 0700 04/13 0701 - 04/14 0700   P.O. 2640    I.V. (mL/kg) 3 (0)    Total Intake(mL/kg) 2643 (12.8)    Urine (mL/kg/hr) 3050 (0.6)    Total Output 3050     Net -407          Urine Occurrence 4 x     PHYSICAL EXAMINATION: General: sitting in chair, on venti mask, morbidly obese HEENT: PERRL, EOMI, no scleral icterus Cardiac: very distant HS, RRR, no rubs, murmurs or gallops Pulm: clear to auscultation bilaterally, moving normal volumes of air Abd: soft, nontender, nondistended, BS present Ext: warm and well perfused, wrapped in compression Neuro: alert and oriented X3, cranial nerves II-XII grossly intact  LABS: Cbc  Recent Labs Lab 08/18/14 2355 08/19/14 0520 08/20/14 0240  WBC 7.1 7.0 6.1  HGB 17.5* 17.6* 17.1*  HCT 55.9* 54.9* 55.6*  PLT 181 182 142*    Chemistry  Recent Labs Lab 08/18/14 2355  08/19/14 0520 08/20/14 0240  NA 139  --  137  K 3.9  --  3.8  CL 89*  --  86*  CO2 44*  --  42*  BUN 12  --  10  CREATININE 0.97 0.87 0.85  CALCIUM 8.8  --  8.4  GLUCOSE 97  --  93   coags  Recent Labs Lab 08/18/14 2355  APTT 32  INR 1.10   Cardiac markers  Recent Labs Lab 08/19/14 0520 08/19/14 1116 08/19/14 1840  TROPONINI 0.05* 0.04* 0.04*   BNP 72  ABG  Recent Labs Lab 08/19/14 0527 08/19/14 0927  PHART 7.249* 7.325*  PCO2ART 101.0* 79.6*  PO2ART 177.0* 61.0*  HCO3 42.7* 41.5*  TCO2 45.8 44   IMAGING: CXR 4/12>> Cardiomegaly, pulmonary vascular congestion CT Angiogram 4/12>>  Limited due to patient size, no evidence of PE LE dopplers 4/13>>NO DVT bilaterally  ECG: 4/12>> sinus tachycardia, 107  DIAGNOSES: Active Problems:   Obesity hypoventilation syndrome   Acute respiratory failure with hypoxia   Elevated troponin   Acute respiratory failure  ASSESSMENT / PLAN:  PULMONARY  ASSESSMENT: Acute Hypercarbic Respiratory Failure with Hypoxia-requiring BiPAP, Lungs clear, no evidence of PE on CTA and negative LE dopplers, volume overload on CXR.  Multifactorial in setting of baseline morbid obesity, OHS, OSA.  Chronic Respiratory Acidosis H/o of tracheostomy-well healed OSA OHS PLAN:    Continue BiPAP qhs Supplemental O2 STRICT 88-93% given daytime hypoxia in setting of OHS Torsemide 20 mg daily for volume overload  CARDIOVASCULAR  ASSESSMENT:  Mild elevated troponin 0.05>>0.04>>0.04: Likely secondary to above, no chest pain.  EKG sinus tachycardia No CHF exacerbation: pro-BNP 72.0, last 2D ECHO 11/2013 EF 60-65%-study technically insufficient to assess diastolic function 2/2 body habitus. Bilateral LE doppler Negative for DVT.   PLAN:   ASA 325 mg daily Torsemide 20 mg daily   Need weight loss   RENAL  ASSESSMENT:   Normal RF: BUN/Cr 12/0.97 PLAN:   Trend Bmet Strict I/Os  GASTROINTESTINAL  ASSESSMENT:   No active issues PLAN:   Advance diet to carb/heart healthy Consider nutrition consult for weight loss assistance  HEMATOLOGIC  ASSESSMENT:   No active issues PT/PTT/INR 14.4/32/1.10 PLAN:  Heparin SQ TID  INFECTIOUS  ASSESSMENT:  Afebrile, no leukocytosis. CXR without signs of infiltrate, although patient complains of one week history of productive cough. Urinalysis negative for infection. History of chronic venous statis, WOC consulted placed Unna boots and recommend outpt followup.  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 Likely multifactorial secondary to OSA, OHS, and morbid obesity. Pt improving now on venti mask. Plan to transfer to med-surg with [facility] to resume care on 4/14.   Christen Bame, MD IM PGY-3 Pgr: [phone]  08/20/2014 7:20 AM   PCCM ATTENDING: I have reviewed pt's initial presentation, consultants notes and hospital database in detail.  The above assessment and plan was formulated under my direction.  In summary: Acute on chronic  ventilatory failure due to decompensated OHS/OSA, now improved after BiPAP support. He is presently comfortable on VM. His cognition is fully intact. He may be transferred to med-surg floor with mandatory nocturnal BiPAP for OSA. I counseled him re: need for an aggressive weight loss strategy   Billy Fischer, MD;  PCCM service; Mobile [phone]

## 2014-08-20 NOTE — Care Management Note (Signed)
  Page 2 of 2   08/22/2014     3:13:27 PM CARE MANAGEMENT NOTE 08/22/2014  Patient:  Tony Mccullough,Tony Mccullough   Account Number:  [id number]  Date Initiated:  08/19/2014  Documentation [facility]:  [facility]  Subjective/Objective Assessment:   Admitted with resp failure on bipap     Action/Plan:   Anticipated DC Date:  08/22/2014   Anticipated DC Plan:  HOME/SELF CARE      DC Planning Services  CM consult      Choice offered to / List presented to:     DME arranged  [facility]GEN  BIPAP      DME agency  [name]        Status of service:  In process, will continue to follow Medicare Important Message given?  YES (If response is "NO", the following Medicare IM given date fields will be blank) Date Medicare IM given:  08/20/2014 Medicare IM given [facility]:  [facility] Date Additional Medicare IM given:   Additional Medicare IM given by:    Discharge Disposition:    Per UR Regulation:  Reviewed for med. necessity/level of care/duration of stay  If discussed at Long Length of Stay Meetings, dates discussed:    Comments:  ContactMerlene Morse:  Wynter,Judy Mother [phone]  [phone]   08-22-14 Multiple calls to [name] [phone]( [phone] )  . Insurance has been vertified , BiPAP approved . Home oxygen orders also sent to [name] . Portable oxygen tank to be delivered to patient's room prior to discharge home. [name] with [facility] aware discharge order has been written and patient waiting on portable tank . Portable oxygen tank to be delivered late this afternoon / early evening . Patient and bedside nurse aware. Ronny FlurryHeather Costantino Kohlbeck RN BSN [phone]  08-21-14 3:40pm Avie ArenasSarah Brown, RNBSN [phone]- [phone] [facility] called [facility] this a.m. and gave insurance information.  I called [name] after this - [facility] has everything they need to set up bipap except insurance approval.  - [facility] [place] [facility].  Still waiting for call back.  Updated patient.  Patient also wanting to get disability - advised once he got [facility]  [facility]  His PCP physician will need to be the one signing the forms needed.  Patient tx off to [address] [facility] - will update CM.  08-20-14 2:10pm Avie ArenasSarah Brown, RNBSN [phone]- [phone] Lives at home with mother, independent.  Has had Bipap but did not pay bill - was taken away.  Since has gotten insurance but was/is some confusion.  [facility] - Talked with [facility] and they have been attempting to get patient by phone as they need to get his new insurance information, to be able to process this to get to him. Communicated this to patient, states he felt that was probably the  problem, plan to get insurance card today - someone can bring to him and he will call [name], gave him two numbers to call.  Once they have the insurance information, we can call to see if they need anything else to process.  Patient will let us know when they have his info.  Will continue to check with him.  Number for  [facility] - Cpap/bipap department are [phone] or [phone].

## 2014-08-20 NOTE — Progress Notes (Signed)
CRITICAL VALUE ALERT  Critical value received:  CO2 42  Date of notification:  08/20/14  Time of notification:  4:43  Critical value read back:Yes.    Nurse who received alert:  Wende NeighborsSara Guillermo Difrancesco. RN  MD notified (1st page):  Dr. Ulice BrilliantK. Glenn  Time of first page:  4:43  MD notified (2nd page):  Time of second page:  Responding MD:  Dr. Ulice BrilliantK. Glenn  Time MD responded:  4:44

## 2014-08-21 DIAGNOSIS — I8311 Varicose veins of right lower extremity with inflammation: Secondary | ICD-10-CM

## 2014-08-21 DIAGNOSIS — I8312 Varicose veins of left lower extremity with inflammation: Secondary | ICD-10-CM

## 2014-08-21 DIAGNOSIS — J9601 Acute respiratory failure with hypoxia: Secondary | ICD-10-CM

## 2014-08-21 LAB — COMPREHENSIVE METABOLIC PANEL
ALK PHOS: 70 U/L (ref 39–117)
ALT: 39 U/L (ref 0–53)
ANION GAP: 6 (ref 5–15)
AST: 39 U/L — ABNORMAL HIGH (ref 0–37)
Albumin: 2.8 g/dL — ABNORMAL LOW (ref 3.5–5.2)
BUN: 9 mg/dL (ref 6–23)
CO2: 43 mmol/L — AB (ref 19–32)
Calcium: 8.4 mg/dL (ref 8.4–10.5)
Chloride: 88 mmol/L — ABNORMAL LOW (ref 96–112)
Creatinine, Ser: 0.72 mg/dL (ref 0.50–1.35)
GFR calc Af Amer: >90 mL/min (ref >90)
Glucose, Bld: 108 mg/dL — ABNORMAL HIGH (ref 70–99)
POTASSIUM: 3.9 mmol/L (ref 3.5–5.1)
Sodium: 137 mmol/L (ref 135–145)
Total Bilirubin: 0.5 mg/dL (ref 0.3–1.2)
Total Protein: 7.8 g/dL (ref 6.0–8.3)

## 2014-08-21 LAB — CBC WITH DIFFERENTIAL/PLATELET
BASOS ABS: 0 10*3/uL (ref 0.0–0.1)
Basophils Relative: 0 % (ref 0–1)
EOS ABS: 0.3 10*3/uL (ref 0.0–0.7)
Eosinophils Relative: 6 % — ABNORMAL HIGH (ref 0–5)
HCT: 52.1 % — ABNORMAL HIGH (ref 39.0–52.0)
Hemoglobin: 16.1 g/dL (ref 13.0–17.0)
LYMPHS ABS: 1.1 10*3/uL (ref 0.7–4.0)
Lymphocytes Relative: 18 % (ref 12–46)
MCH: 30.7 pg (ref 26.0–34.0)
MCHC: 30.9 g/dL (ref 30.0–36.0)
MCV: 99.2 fL (ref 78.0–100.0)
Monocytes Absolute: 0.8 10*3/uL (ref 0.1–1.0)
Monocytes Relative: 14 % — ABNORMAL HIGH (ref 3–12)
NEUTROS PCT: 62 % (ref 43–77)
Neutro Abs: 3.5 10*3/uL (ref 1.7–7.7)
PLATELETS: 150 10*3/uL (ref 150–400)
RBC: 5.25 MIL/uL (ref 4.22–5.81)
RDW: 16.1 % — AB (ref 11.5–15.5)
WBC: 5.7 10*3/uL (ref 4.0–10.5)

## 2014-08-21 MED ORDER — TRAMADOL HCL 50 MG PO TABS: 50.0000 mg | ORAL_TABLET | Freq: Four times a day (QID) | ORAL | Status: DC | PRN

## 2014-08-21 MED ORDER — ACETAMINOPHEN 325 MG PO TABS
650.0000 mg | ORAL_TABLET | Freq: Four times a day (QID) | ORAL | Status: DC | PRN
  Administered 2014-08-21: 650 mg via ORAL
  Filled 2014-08-21: qty 2

## 2014-08-21 NOTE — Progress Notes (Signed)
[place] TEAM 1 - Stepdown/ICU TEAM Progress Note  Tony GuestWilliam D Mccullough HQI:[id number] DOB: 08/01/1980 DOA: 08/18/2014 PCP: Sanda Lingerhomas Jones, MD  Admit HPI / Brief Narrative: 34 year old male with history of morbid obesity, chronic resp failure, OHS/OSA on CPAP, CHF/lymphedema/venous stasis, and HTN presented with encephalopathy, acute on chronic respiratory failure, chest pain, and an elevated troponin.   In the ED patient was afebrile, tachycardic into the 130s, tachypneic in the 20s, normotensive, satting 79% on room air initially and then in the mid to low 80s on room air. No leukocytosis, hemoglobin 17.5, creatinine 0.97, bicarbonate 44. INR 1.1, troponin 0.05. Urinalysis was not indicative of infection. EKG sinus tachycardia. CT angiogram negative for PE. Pt placed on BiPAP at bedside as patient was persistently de-satting likely secondary to severe OSA and morbid obesity. PCO2 101 after being on BiPAP for 2 hours.   HPI/Subjective: Pt is alert and conversant.  He c/o a mild generalized HA.  He denies cp, sob, n/v, or abdom pain.  He does report some sinus congestion.  He is motivated to loose weight, and is interested in connecting w/ the bariatric surgery team as an outpt.    Assessment/Plan:  Acute Hypercarbic Respiratory Failure with Hypoxia - decompensated Obesity hypoventilation syndrome Improved acutely after BIPAP - requiring BiPAP QHS - supplemental O2 w/ STRICT goal sats of 88-93% during the day - no evidence of PE on CTA and negative LE dopplers - primary etiology appears to be morbid obesity, OHS, OSA - needs home BIPAP QHS in order to be d/c   Mild elevated troponin 0.05>>0.04>>0.04 EKG sinus tachycardia - no sx to suggest USAP  Presumed diastolic CHF Prior TTEs have not been able to comment on diastolic fxn due to body habitus   HTN BP variable, but improved in general - follow w/o change today   Chronic venous statis WOC consulted - placed Unna boots   Morbid Obesity - Body  mass index is 60.04 kg/(m^2).   MRSA Screen +  Code Status: FULL Family Communication: no family present at time of exam Disposition Plan: transfer to medical bed - CM to assist in setting up home BIPAP - PT/OT   Consultants: PCCM  Antibiotics: none  DVT prophylaxis: SQ heparin   Objective: Blood pressure 141/76, pulse 92, temperature 97.8 F (36.6 C), temperature source Oral, resp. rate 32, height 6\' 1"  (1.854 m), weight 206.387 kg (455 lb), SpO2 97 %.  Intake/Output Summary (Last 24 hours) at 08/21/14 0834 Last data filed at 08/21/14 [id number]  Gross per 24 hour  Intake   2359 ml  Output   3145 ml  Net   -786 ml   Exam: General: No acute respiratory distress Lungs: BS very distant but appear to be clear - no wheeze  Cardiovascular: Very distant hs, no appreciable M or rub - RRR Abdomen: Nontender, morbidly obese, soft, bowel sounds positive, no rebound, no ascites, no appreciable mass Extremities: No significant cyanosis, or clubbing;  3+ chronic appearing edema bilateral lower extremities w/ changes of chronic venous stasis   Data Reviewed: Basic Metabolic Panel:  Recent Labs Lab 08/18/14 2355 08/19/14 0520 08/20/14 0240 08/21/14 0205  NA 139  --  137 137  K 3.9  --  3.8 3.9  CL 89*  --  86* 88*  CO2 44*  --  42* 43*  GLUCOSE 97  --  93 108*  BUN 12  --  10 9  CREATININE 0.97 0.87 0.85 0.72  CALCIUM 8.8  --  8.4 8.4    Liver Function Tests:  Recent Labs Lab 08/20/14 0240 08/21/14 0205  AST 47* 39*  ALT 47 39  ALKPHOS 70 70  BILITOT 0.9 0.5  PROT 8.0 7.8  ALBUMIN 3.0* 2.8*   Coags:  Recent Labs Lab 08/18/14 2355  INR 1.10    Recent Labs Lab 08/18/14 2355  APTT 32    CBC:  Recent Labs Lab 08/18/14 2355 08/19/14 0520 08/20/14 0240 08/21/14 0205  WBC 7.1 7.0 6.1 5.7  NEUTROABS 4.4  --  3.8 3.5  HGB 17.5* 17.6* 17.1* 16.1  HCT 55.9* 54.9* 55.6* 52.1*  MCV 98.8 98.7 99.5 99.2  PLT 181 182 142* 150    Cardiac Enzymes:  Recent  Labs Lab 08/18/14 2355 08/19/14 0520 08/19/14 1116 08/19/14 1840  TROPONINI 0.05* 0.05* 0.04* 0.04*    CBG:  Recent Labs Lab 08/19/14 1012 08/19/14 1606 08/19/14 2006  GLUCAP 120* 100* 102*    Recent Results (from the past 240 hour(s))  MRSA PCR Screening     Status: Abnormal   Collection Time: 08/19/14 10:36 AM  Result Value Ref Range Status   MRSA by PCR POSITIVE (A) NEGATIVE Final    Comment:        The GeneXpert MRSA Assay (FDA approved for NASAL specimens only), is one component of a comprehensive MRSA colonization surveillance program. It is not intended to diagnose MRSA infection nor to guide or monitor treatment for MRSA infections. RESULT CALLED TO, READ BACK BY AND VERIFIED WITH: BAYNES RN 12:25 08/19/14 ([email or web address])      Studies:   Recent x-ray studies have been reviewed in detail by the Attending Physician  Scheduled Meds:  Scheduled Meds: . antiseptic oral rinse  7 mL Mouth Rinse q12n4p  . aspirin EC  325 mg Oral Daily  . chlorhexidine  15 mL Mouth Rinse BID  . Chlorhexidine Gluconate Cloth  6 each Topical Q0600  . fluticasone  2 spray Each Nare Daily  . heparin  5,000 Units Subcutaneous 3 times per day  . loratadine  10 mg Oral Daily  . mupirocin ointment  1 application Nasal BID  . sodium chloride  3 mL Intravenous Q12H  . torsemide  20 mg Oral Daily    Time spent on care of this patient: 35 mins   Tomi Grandpre T , MD   [facility] Office  [phone] Pager - Text Page per Loretha Stapler as per below:  On-Call/Text Page:      [email or web address]      password [email or web address]  If 7PM-7AM, please contact night-coverage [email or web address] Password [facility] 08/21/2014, 8:34 AM   LOS: 2 days

## 2014-08-21 NOTE — Progress Notes (Signed)
CRITICAL VALUE ALERT  Critical value received:  CO2 43  Date of notification:  08/21/14  Time of notification:  4:00  Critical value read back:Yes.    Nurse who received alert:  Wende NeighborsSara Athanasios Heldman  MD notified (1st page):  Jamal Maesiana Troung  Time of first page:  4:40  MD notified (2nd page):Diana Vivianne Masterroung  Time of second page:4:45   Responding MD:  Joneen RoachPaul Hoffman  Time MD responded:  4:49

## 2014-08-21 NOTE — Progress Notes (Signed)
SATURATION QUALIFICATIONS: (This note is used to comply with regulatory documentation for home oxygen)  Patient Saturations on Room Air at Rest = 83%  Patient Saturations on Room Air while Ambulating = Not appropriate  Patient Saturations on 4L Liters of oxygen while Ambulating = 93-95%  Please briefly explain why patient needs home oxygen: Patient currently requires supplemental oxygen to maintain a safe SpO2 level at rest, and while ambulating.

## 2014-08-21 NOTE — Evaluation (Signed)
Physical Therapy Evaluation Patient Details Name: Tony Mccullough MRN: [id number] DOB: 01-17-1981 Today's Date: 08/21/2014   History of Present Illness  34 year old male with history of morbid obesity, chronic resp failure, OHS/OSA on CPAP, CHF/lymphedema/venous stasis, and HTN presented with encephalopathy, acute on chronic respiratory failure, chest pain, and an elevated troponin.  Clinical Impression  Pt admitted with above diagnosis. Pt currently with functional limitations due to the deficits listed below (see PT Problem List). Ambulates generally well without an assistive device. Fatigues easily with 2/4 dyspnea. At rest, SpO2 was 83% on room air; 2L supplemental O2 applied and improved to 92%. Required 4L supplemental O2 to maintain SpO2 of 93% while ambulating. Reports family is available PRN. Will continue to follow and progress until d/c. Feel he is adequate for d/c from a mobility standpoint when medically stable.       Follow Up Recommendations No PT follow up    Equipment Recommendations  Other (comment) (May require supplemental O2 pending MD prescription)    Recommendations for Other Services       Precautions / Restrictions Precautions Precautions: None Restrictions Weight Bearing Restrictions: No      Mobility  Bed Mobility               General bed mobility comments: Sitting in chair  Transfers Overall transfer level: Modified independent               General transfer comment: Performed from toilet, and recliner x2  Ambulation/Gait Ambulation/Gait assistance: Supervision Ambulation Distance (Feet): 165 Feet (additonal bout of 150) Assistive device: None Gait Pattern/deviations: Step-through pattern;Wide base of support Gait velocity: decreased Gait velocity interpretation: Below normal speed for age/gender General Gait Details: Ambulates generally well with notable heavy weight shift bil due to obesity. No loss of balance noted. Pt sats  dropped to 85% on 2L supplemental O2, increased to 93% and greater on 4L supplemental O2. Did not require physical assist during bout. Good control of balance with turns.  Stairs            Wheelchair Mobility    Modified Rankin (Stroke Patients Only)       Balance Overall balance assessment: Needs assistance Sitting-balance support: No upper extremity supported;Feet supported Sitting balance-Leahy Scale: Normal     Standing balance support: No upper extremity supported Standing balance-Leahy Scale: Good                               Pertinent Vitals/Pain Pain Assessment: 0-10 Pain Score: 2  Pain Location: chest- worse with deep breaths Pain Descriptors / Indicators: Discomfort Pain Intervention(s): Monitored during session;Repositioned    Home Living Family/patient expects to be discharged to:: Private residence Living Arrangements: Parent;Other relatives (Brother) Available Help at Discharge: Family;Available PRN/intermittently Type of Home: House Home Access: Stairs to enter Entrance Stairs-Rails: Doctor, general practice of Steps: 4-5 Home Layout: One level Home Equipment: Walker - 2 wheels;Cane - single point      Prior Function Level of Independence: Independent               Hand Dominance   Dominant Hand: Right    Extremity/Trunk Assessment   Upper Extremity Assessment: Defer to OT evaluation           Lower Extremity Assessment: Overall WFL for tasks assessed (unaboots bil)         Communication   Communication: No difficulties  Cognition Arousal/Alertness: Awake/alert Behavior  During Therapy: WFL for tasks assessed/performed Overall Cognitive Status: Within Functional Limits for tasks assessed                      General Comments General comments (skin integrity, edema, etc.): SpO2 on room air at rest when PT entered room 83%, Improved to 92-93% at rest on 2L. Ambulating SpO2 down to 85% on 2L,  improved to 93% and greater on 4L with cues for pursed lip breathing technique.    Exercises        Assessment/Plan    PT Assessment Patient needs continued PT services  PT Diagnosis Abnormality of gait;Acute pain   PT Problem List Decreased activity tolerance;Decreased balance;Decreased mobility;Cardiopulmonary status limiting activity;Obesity;Pain  PT Treatment Interventions DME instruction;Gait training;Stair training;Functional mobility training;Therapeutic activities;Therapeutic exercise;Balance training;Neuromuscular re-education;Patient/family education   PT Goals (Current goals can be found in the Care Plan section) Acute Rehab PT Goals Patient Stated Goal: Lose weight PT Goal Formulation: With patient Time For Goal Achievement: 09/04/14 Potential to Achieve Goals: Good    Frequency Min 3X/week   Barriers to discharge        Co-evaluation               End of Session Equipment Utilized During Treatment: Gait belt;Oxygen Activity Tolerance: Patient tolerated treatment well Patient left: in chair;with call [place]/phone within reach Nurse Communication: Mobility status;Other (comment) (Spoke with coverage RN about SpO2 levels)         Time: [id number] PT Time Calculation (min) (ACUTE ONLY): 35 min   Charges:   PT Evaluation $Initial PT Evaluation Tier I: 1 Procedure PT Treatments $Gait Training: 8-22 mins   PT G Codes:        Berton MountBarbour, Rubel Heckard S 08/21/2014, 7:04 PM Sunday SpillersLogan Secor [place], [place] [phone]

## 2014-08-22 ENCOUNTER — Inpatient Hospital Stay (HOSPITAL_COMMUNITY): Payer: 59

## 2014-08-22 DIAGNOSIS — R0602 Shortness of breath: Secondary | ICD-10-CM | POA: Insufficient documentation

## 2014-08-22 LAB — GLUCOSE, CAPILLARY: GLUCOSE-CAPILLARY: 116 mg/dL — AB (ref 70–99)

## 2014-08-22 MED ORDER — TRAMADOL HCL 50 MG PO TABS: 50.0000 mg | ORAL_TABLET | Freq: Four times a day (QID) | ORAL | Status: DC | PRN

## 2014-08-22 MED ORDER — LORATADINE 10 MG PO TABS: 10.0000 mg | ORAL_TABLET | Freq: Every day | ORAL | Status: DC

## 2014-08-22 MED ORDER — FLUTICASONE PROPIONATE 50 MCG/ACT NA SUSP: 2.0000 | Freq: Every day | NASAL | Status: DC

## 2014-08-22 MED ORDER — ASPIRIN 325 MG PO TBEC: 325.0000 mg | DELAYED_RELEASE_TABLET | Freq: Every day | ORAL | Status: AC

## 2014-08-22 NOTE — Progress Notes (Signed)
OT Cancellation Note  Patient Details Name: Tony GuestWilliam D Mccullough MRN: [id number] DOB: 08/21/1980   Cancelled Treatment:    Reason Eval/Treat Not Completed: OT screened, no needs identified, will sign off.  Angelene GiovanniConarpe, Raissa Dam M  Dustan Hyams Colbyonarpe, OTR/L [phone]  08/22/2014, 3:10 PM

## 2014-08-22 NOTE — Progress Notes (Signed)
Physical Therapy Treatment Patient Details Name: Tony GuestWilliam D Mccullough MRN: [id number] DOB: 01/17/1981 Today's Date: 08/22/2014    History of Present Illness 34 year old male with history of morbid obesity, chronic resp failure, OHS/OSA on CPAP, CHF/lymphedema/venous stasis, and HTN presented with encephalopathy, acute on chronic respiratory failure, chest pain, and an elevated troponin.    PT Comments    Progressing towards PT goals. Continues to require 4L supplemental O2 to safely ambulate with SpO2 of >88%. Resting sats improved today, room air of 92-94%, however ambulating sats drop to 82% on room air. Will continue to monitior and progress until d/c. Does not requires physical assist for balance. O2 tank carried for patient safety.  Follow Up Recommendations  No PT follow up     Equipment Recommendations  None recommended by PT    Recommendations for Other Services       Precautions / Restrictions Precautions Precautions: None Precaution Comments: Monitor O2 Restrictions Weight Bearing Restrictions: No    Mobility  Bed Mobility               General bed mobility comments: Sitting in chair  Transfers Overall transfer level: Modified independent                  Ambulation/Gait Ambulation/Gait assistance: Supervision Ambulation Distance (Feet): 150 Feet (additonal bout of 125) Assistive device: None Gait Pattern/deviations: Step-through pattern;Wide base of support Gait velocity: decreased   General Gait Details: No changes in balance today, continues to require assist to carry O2 tank. SpO2 on room air ambulating dropped to 82%, improved to 91% with 4L O2. Dyspnea on room air, not noted with 4L supplemental O2.   Stairs            Wheelchair Mobility    Modified Rankin (Stroke Patients Only)       Balance                                    Cognition Arousal/Alertness: Awake/alert Behavior During Therapy: WFL for tasks  assessed/performed Overall Cognitive Status: Within Functional Limits for tasks assessed                      Exercises      General Comments General comments (skin integrity, edema, etc.): Time spent discussing SpO2 which MD note recommend patient maintain strict level of 88-93%. Pt reports he will purchase a pulse ox for home use. Verbalizes understanding of monitoring SpO2 regularly and adjusting supplemental O2 as needed based on % SpO2.      Pertinent Vitals/Pain Pain Assessment: No/denies pain Pain Intervention(s): Monitored during session    Home Living                      Prior Function            PT Goals (current goals can now be found in the care plan section) Acute Rehab PT Goals Patient Stated Goal: Lose weight PT Goal Formulation: With patient Time For Goal Achievement: 09/04/14 Potential to Achieve Goals: Good Progress towards PT goals: Progressing toward goals    Frequency  Min 3X/week    PT Plan Current plan remains appropriate    Co-evaluation             End of Session Equipment Utilized During Treatment: Oxygen Activity Tolerance: Patient tolerated treatment well Patient left: in chair;with call Dehaan/phone  within reach     Time: 1511-1530 PT Time Calculation (min) (ACUTE ONLY): 19 min  Charges:  $Gait Training: 8-22 mins                    G Codes:      Berton Mount 09/10/14, 4:20 PM Sunday Spillers [place], [place] [phone]

## 2014-08-22 NOTE — Progress Notes (Signed)
Discharge instructions read to patient and understanding verbalized. IV site d/c'd. Patient awaiting delivery of portable oxygen at this time before leaving for home.

## 2014-08-22 NOTE — Care Management (Signed)
Spoke to [place] and [name] again at [facility] , awaiting insurance vertication for BiPAP. Ronny FlurryHeather Milen Lengacher RN BSN

## 2014-08-22 NOTE — Care Management (Signed)
[name] with [facility] checking on patient's insurance for BiPAP. [name] RN BSN

## 2014-08-22 NOTE — Discharge Summary (Signed)
Physician Discharge Summary  Tony Mccullough MRN: [id number] DOB/AGE: [birth year] 34 y.o.  PCP: Scarlette Calico, MD   Admit date: 08/18/2014 Discharge date: 08/22/2014  Discharge Diagnoses:     Active Problems:   Obesity hypoventilation syndrome   Acute respiratory failure with hypoxia   Elevated troponin   Acute respiratory failure  bilateral lower extremity wounds present upon admission   Follow-up recommendations Follow-up with PCP in 5-7 days Follow-up CBC, CMP in 1 week Follow up with outpatient wound care center Continue to use Aquacel for the dressing when compression wraps are changed    Medication List    TAKE these medications        aspirin 325 MG EC tablet  Take 1 tablet (325 mg total) by mouth daily.     Azilsartan Medoxomil 40 MG Tabs  Commonly known as:  EDARBI  Take 1 tablet by mouth daily.     fluticasone 50 MCG/ACT nasal spray  Commonly known as:  FLONASE  Place 2 sprays into both nostrils daily.     loratadine 10 MG tablet  Commonly known as:  CLARITIN  Take 1 tablet (10 mg total) by mouth daily.     torsemide 20 MG tablet  Commonly known as:  DEMADEX  Take 1 tablet (20 mg total) by mouth daily.     traMADol 50 MG tablet  Commonly known as:  ULTRAM  Take 1 tablet (50 mg total) by mouth every 6 (six) hours as needed for moderate pain.        Discharge Condition: Stable Disposition: 01-Home or Self Care   Consults Wound care consultation Critical care  Significant Diagnostic Studies: Dg Chest 2 View  08/22/2014   CLINICAL DATA:  Cough and shortness of breath  EXAM: CHEST  2 VIEW  COMPARISON:  August 20, 2014 chest radiograph and August 19, 2014 chest CT  FINDINGS: There is no edema or consolidation. Heart is mildly enlarged with pulmonary vascularity within normal limits. No adenopathy. No bone lesions.  IMPRESSION: Mild cardiac enlargement.  No edema or consolidation.   Electronically Signed   By: Lowella Grip III M.D.   On:  08/22/2014 10:48   Dg Chest 2 View  08/19/2014   CLINICAL DATA:  Shortness of breath  EXAM: CHEST  2 VIEW  COMPARISON:  08/01/2013  FINDINGS: Mild cardiomegaly and congestive interstitial coarsening without overt edema. Stable aortic and hilar contours. No pneumonia, effusion, or pneumothorax.  IMPRESSION: Cardiomegaly and pulmonary venous congestion.   Electronically Signed   By: Monte Fantasia M.D.   On: 08/19/2014 01:29   Ct Angio Chest Pe W/cm &/or Wo Cm  08/19/2014   CLINICAL DATA:  Unspecified chest pain.  EXAM: CT ANGIOGRAPHY CHEST WITH CONTRAST  TECHNIQUE: Multidetector CT imaging of the chest was performed using the standard protocol during bolus administration of intravenous contrast. Multiplanar CT image reconstructions and MIPs were obtained to evaluate the vascular anatomy.  CONTRAST:  183mL OMNIPAQUE IOHEXOL 350 MG/ML SOLN  COMPARISON:  None.  FINDINGS: THORACIC INLET/BODY WALL:  No acute abnormality.  MEDIASTINUM:  Normal heart size.  No pericardial effusion.  CTA of the pulmonary arteries is limited by patient size and intermittent motion, with the potential to miss segmental or smaller pulmonary embolism. There is no convincing pulmonary artery filling defect. No aortic dissection.  LUNG WINDOWS:  Mild atelectasis.  No consolidation or edema.  No pleural effusion.  UPPER ABDOMEN:  No acute findings.  OSSEOUS:  No acute fracture.  No  suspicious lytic or blastic lesions.  Review of the MIP images confirms the above findings.  IMPRESSION: Limited study due to patient size; no evidence of pulmonary embolism.   Electronically Signed   By: Monte Fantasia M.D.   On: 08/19/2014 03:03   Dg Chest Port 1 View  08/20/2014   CLINICAL DATA:  Shortness of breath.  EXAM: PORTABLE CHEST - 1 VIEW  COMPARISON:  08/19/2014  FINDINGS: Technically limited film because of patient's size and portable nature. Cardiomegaly persists no evidence of pulmonary edema. One could not rule out some patchy basilar  infiltrate or atelectasis, but given the technical limitations, this is not a high reliability finding. No effusions. No bony abnormalities.  IMPRESSION: Technical limitations as above. Cannot rule out basilar patchy pneumonia or atelectasis, though the findings could simply relate to technical factors described above.   Electronically Signed   By: Nelson Chimes M.D.   On: 08/20/2014 07:30      Microbiology: Recent Results (from the past 240 hour(s))  MRSA PCR Screening     Status: Abnormal   Collection Time: 08/19/14 10:36 AM  Result Value Ref Range Status   MRSA by PCR POSITIVE (A) NEGATIVE Final    Comment:        The GeneXpert MRSA Assay (FDA approved for NASAL specimens only), is one component of a comprehensive MRSA colonization surveillance program. It is not intended to diagnose MRSA infection nor to guide or monitor treatment for MRSA infections. RESULT CALLED TO, READ BACK BY AND VERIFIED WITH: BAYNES RN 12:25 08/19/14 ([email or web address])      Labs: Results for orders placed or performed during the hospital encounter of 08/18/14 (from the past 48 hour(s))  Comprehensive metabolic panel     Status: Abnormal   Collection Time: 08/21/14  2:05 AM  Result Value Ref Range   Sodium 137 135 - 145 mmol/L   Potassium 3.9 3.5 - 5.1 mmol/L   Chloride 88 (L) 96 - 112 mmol/L   CO2 43 (HH) 19 - 32 mmol/L    Comment: REPEATED TO VERIFY CRITICAL RESULT CALLED TO, READ BACK BY AND VERIFIED WITH: MYERS S,RN 08/21/14 0324 WAYK    Glucose, Bld 108 (H) 70 - 99 mg/dL   BUN 9 6 - 23 mg/dL   Creatinine, Ser 0.72 0.50 - 1.35 mg/dL   Calcium 8.4 8.4 - 10.5 mg/dL   Total Protein 7.8 6.0 - 8.3 g/dL   Albumin 2.8 (L) 3.5 - 5.2 g/dL   AST 39 (H) 0 - 37 U/L   ALT 39 0 - 53 U/L   Alkaline Phosphatase 70 39 - 117 U/L   Total Bilirubin 0.5 0.3 - 1.2 mg/dL   GFR calc non Af Amer >90 >90 mL/min   GFR calc Af Amer >90 >90 mL/min    Comment: (NOTE) The eGFR has been calculated using the CKD EPI  equation. This calculation has not been validated in all clinical situations. eGFR's persistently <90 mL/min signify possible Chronic Kidney Disease.    Anion gap 6 5 - 15  CBC WITH DIFFERENTIAL     Status: Abnormal   Collection Time: 08/21/14  2:05 AM  Result Value Ref Range   WBC 5.7 4.0 - 10.5 K/uL   RBC 5.25 4.22 - 5.81 MIL/uL   Hemoglobin 16.1 13.0 - 17.0 g/dL   HCT 52.1 (H) 39.0 - 52.0 %   MCV 99.2 78.0 - 100.0 fL   MCH 30.7 26.0 - 34.0 pg   MCHC 30.9 30.0 -  36.0 g/dL   RDW 16.1 (H) 11.5 - 15.5 %   Platelets 150 150 - 400 K/uL   Neutrophils Relative % 62 43 - 77 %   Neutro Abs 3.5 1.7 - 7.7 K/uL   Lymphocytes Relative 18 12 - 46 %   Lymphs Abs 1.1 0.7 - 4.0 K/uL   Monocytes Relative 14 (H) 3 - 12 %   Monocytes Absolute 0.8 0.1 - 1.0 K/uL   Eosinophils Relative 6 (H) 0 - 5 %   Eosinophils Absolute 0.3 0.0 - 0.7 K/uL   Basophils Relative 0 0 - 1 %   Basophils Absolute 0.0 0.0 - 0.1 K/uL  Glucose, capillary     Status: Abnormal   Collection Time: 08/22/14  7:43 AM  Result Value Ref Range   Glucose-Capillary 116 (H) 70 - 99 mg/dL     HPI :34 y.o. year old male with significant past medical history of morbid obesity, chronic resp failure/OHS-not on O2, OSA on CPAP, CHF, HTN presenting with cephalopathy, acute on chronic respiratory failure, chest pain, elevated troponin. Level V caveat as patient is acutely encephalopathic on BiPAP. Per report, patient with progressively worsening chest pain and shortness of breath. Per report, this is been a chronic issue. Presented to the ER because of worsening symptoms. Had worsening dyspnea on exertion, which is off patient's baseline. Presented to ER afebrile, heart rate into the 130s, respirations in the tens to 20s, blood pressure in the 100s to 150s, satting in the mid to low 80s on room air-improved with supplemental oxygen placement to the mid 90s. Blood cell count 7.0, hemoglobin 17.5, creatinine 0.97, bicarbonate 44. INR 1.1,  troponin 0.05. Urinalysis is not indicative of infection. EKG sinus tachycardia with? S1Q3T3 pattern. Follow-up CT angiogram negative for PE. Pt placed on BiPAP at bedside.    HOSPITAL COURSE:  Acute Hypercarbic Respiratory Failure with Hypoxia - decompensated Obesity hypoventilation syndrome Improved acutely after BIPAP, PCO2 101 upon admission - requiring BiPAP QHS - supplemental O2 w/ STRICT goal sats of 88-93% during the day - no evidence of PE on CTA and negative LE dopplers - primary etiology appears to be morbid obesity, OHS, OSA - needs home BIPAP QHS in order to be d/c   Mild elevated troponin 0.05>>0.04>>0.04 EKG sinus tachycardia - no sx to suggest USAP  Presumed diastolic CHF Prior TTEs have not been able to comment on diastolic fxn due to body habitus   HTN BP variable, but improved in general - follow w/o change today   Chronic venous statis WOC consulted - placed Unna boots  Patient to follow-up with outpatient wound care center Dressing changes As mentioned above   Morbid Obesity - Body mass index is 60.04 kg/(m^2).    Discharge Exam:    Blood pressure 124/89, pulse 93, temperature 98.2 F (36.8 C), temperature source Oral, resp. rate 21, height $RemoveBe'6\' 1"'heOjFRMPU$  (1.854 m), weight 206.387 kg (455 lb), SpO2 93 %.  General: No acute respiratory distress Lungs: BS very distant but appear to be clear - no wheeze  Cardiovascular: Very distant hs, no appreciable M or rub - RRR Abdomen: Nontender, morbidly obese, soft, bowel sounds positive, no rebound, no ascites, no appreciable mass Extremities: No significant cyanosis, or clubbing; 3+ chronic appearing edema bilateral lower extremities w/ changes of chronic venous stasis        Discharge Instructions    Diet - low sodium heart healthy    Complete by:  As directed      Increase activity  slowly    Complete by:  As directed            Follow-up Information    Follow up with Scarlette Calico, MD. Schedule an appointment as  soon as possible for a visit in 3 days.   Specialty:  Internal Medicine   Contact information:   [address] [address] [phone]       Signed: Reyne Dumas 08/22/2014, 10:55 AM

## 2014-08-22 NOTE — Progress Notes (Signed)
Patient discharged to home.  To door via wheelchair.  Home via POV with a friend driving.

## 2014-08-22 NOTE — Progress Notes (Signed)
PULMONARY  / CRITICAL CARE MEDICINE  Name: Tony Mccullough MRN: [id number] DOB: 10/13/1980    LOS: 3  REFERRING MD :  Dr. Alvester Mccullough, [facility]  CHIEF COMPLAINT:  Shortness of Breath  BRIEF PATIENT DESCRIPTION: Mr. Tony Mccullough is a 34 yo male with PMHx of morbid obesity, HTN, h/o respiratory failure s/p pneumonia and s/p tracheostomy in 2009, severe OSA, lymphedema and venous stasis and CHF who [facility] with complaints of shortness of breath.   LINES / TUBES: PIV  CULTURES: None  ANTIBIOTICS: None  SIGNIFICANT EVENTS:  4/12>> Admitted to MICU  LEVEL OF CARE:  ICU PRIMARY SERVICE:  PCCM CONSULTANTS:  None CODE STATUS: FULL DIET:  NPO DVT Px: Heparin TID SQ  HISTORY OF PRESENT ILLNESS:  Mr. Tony Mccullough is a 34 yo male with PMHx of morbid obesity, HTN, h/o respiratory failure s/p pneumonia and s/p tracheostomy in 2009, severe OSA, lymphedema and venous stasis and CHF who [facility] with complaints of shortness of breath for the past 2 days. Shortness of breath is worse with exertion. He also admits to productive cough of thick white sputum for one week. He denies any fever, chills, chest pain, nausea, vomiting. He states he has been compliant with his medications and only missed a dose one week ago. On presentation, patient was afebrile, tachycardic into the 130s, tachypneic in the 20s, normotensive, satting 79% on room air initially and then in the mid to low 80s on room air. No leukocytosis 7.0, hemoglobin 17.5, creatinine 0.97, bicarbonate 44. INR 1.1, troponin 0.05. Urinalysis is not indicative of infection. EKG sinus tachycardia. CT angiogram negative for PE. Pt placed on BiPAP at bedside as patient was persistently de-satting likely secondary to severe OSA and morbid obesity. PCO2 101 after being on BiPAP for 2 hours.   Subjective: Pt feels well , talking on the phone.   VITAL SIGNS: Temp:  [98.1 F (36.7 C)-98.6 F (37 C)] 98.2 F (36.8 C) (04/15 0623) Pulse Rate:  [86-96] 93  (04/15 0623) Resp:  [18-21] 21 (04/15 0623) BP: (97-124)/(66-89) 124/89 mmHg (04/15 0623) SpO2:  [89 %-100 %] 93 % (04/15 0623)  VENTILATOR SETTINGS:   INTAKE / OUTPUT: Intake/Output      04/14 0701 - 04/15 0700 04/15 0701 - 04/16 0700   P.O. 1000    I.V. (mL/kg)     Total Intake(mL/kg) 1000 (4.8)    Urine (mL/kg/hr) 1700 (0.3)    Stool     Total Output 1700     Net -700          Urine Occurrence 2 x    Stool Occurrence 1 x     PHYSICAL EXAMINATION: General: sitting in chair, on Funk, morbidly obese HEENT: PERRL, EOMI, no scleral icterus Cardiac: very distant HS, RRR, no rubs, murmurs or gallops Pulm: clear to auscultation bilaterally, moving normal volumes of air Abd: soft, nontender, nondistended, BS present Ext: warm and well perfused, wrapped in compression Neuro: alert and oriented X3, cranial nerves II-XII grossly intact  LABS: Cbc  Recent Labs Lab 08/19/14 0520 08/20/14 0240 08/21/14 0205  WBC 7.0 6.1 5.7  HGB 17.6* 17.1* 16.1  HCT 54.9* 55.6* 52.1*  PLT 182 142* 150    Chemistry  Recent Labs Lab 08/18/14 2355 08/19/14 0520 08/20/14 0240 08/21/14 0205  NA 139  --  137 137  K 3.9  --  3.8 3.9  CL 89*  --  86* 88*  CO2 44*  --  42* 43*  BUN 12  --  10 9  CREATININE 0.97 0.87 0.85 0.72  CALCIUM 8.8  --  8.4 8.4  GLUCOSE 97  --  93 108*   coags  Recent Labs Lab 08/18/14 2355  APTT 32  INR 1.10   Cardiac markers  Recent Labs Lab 08/19/14 0520 08/19/14 1116 08/19/14 1840  TROPONINI 0.05* 0.04* 0.04*   BNP 72  ABG  Recent Labs Lab 08/19/14 0527 08/19/14 0927  PHART 7.249* 7.325*  PCO2ART 101.0* 79.6*  PO2ART 177.0* 61.0*  HCO3 42.7* 41.5*  TCO2 45.8 44   IMAGING: CXR 4/12>> Cardiomegaly, pulmonary vascular congestion CT Angiogram 4/12>> Limited due to patient size, no evidence of PE LE dopplers 4/13>>NO DVT bilaterally  ECG: 4/12>> sinus tachycardia, 107  DIAGNOSES: Active Problems:   Obesity hypoventilation  syndrome   Acute respiratory failure with hypoxia   Elevated troponin   Acute respiratory failure  ASSESSMENT / PLAN:  PULMONARY  ASSESSMENT: Acute Hypercarbic Respiratory Failure with Hypoxia-requiring BiPAP, Lungs clear, no evidence of PE on CTA and negative LE dopplers, volume overload on CXR.  Multifactorial in setting of baseline morbid obesity, OHS, OSA.  Chronic Respiratory Acidosis H/o of tracheostomy-well healed OSA OHS PLAN:   Continue BiPAP qhs Supplemental O2 STRICT 88-93% given daytime hypoxia in setting of OHS Torsemide 20 mg daily for volume overload  CARDIOVASCULAR  ASSESSMENT:  Mild elevated troponin 0.05>>0.04>>0.04: Likely secondary to above, no chest pain.  EKG sinus tachycardia No CHF exacerbation: pro-BNP 72.0, last 2D ECHO 11/2013 EF 60-65%-study technically insufficient to assess diastolic function 2/2 body habitus. Bilateral LE doppler Negative for DVT.   PLAN:   ASA 325 mg daily Torsemide 20 mg daily   Need weight loss   RENAL  ASSESSMENT:   Normal RF: BUN/Cr 12/0.97 PLAN:   Trend Bmet Strict I/Os  GASTROINTESTINAL  ASSESSMENT:   No active issues PLAN:   Advance diet to carb/heart healthy Consider nutrition consult for weight loss assistance  HEMATOLOGIC  ASSESSMENT:   No active issues PT/PTT/INR 14.4/32/1.10 PLAN:  Heparin SQ TID  INFECTIOUS  ASSESSMENT:  Afebrile, no leukocytosis. CXR without signs of infiltrate, although patient complains of one week history of productive cough. Urinalysis negative for infection. History of chronic venous statis, WOC consulted placed Unna boots and recommend outpt followup.  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 Tony Mccullough is a 34 yo male with PMHx of morbid obesity, HTN, h/o respiratory failure s/p pneumonia and s/p tracheostomy in 2009, severe OSA, lymphedema and venous stasis with CHF who was found to be in acute hypercarbic  respiratory failure with hypoxia. Likely multifactorial secondary to OSA, OHS, and morbid obesity. Pt improving now on venti mask. Plan to transfer to med-surg with [facility] to resume care on 4/14.   Acute on chronic ventilatory failure due to decompensated OHS/OSA, now improved after BiPAP support. He is presently comfortable on VM. His cognition is fully intact.Transferred to med-surg floor(4/14) with mandatory nocturnal BiPAP for OSA. I counseled him re: need for an aggressive weight loss strategy   Brett Canales Minor ACNP [facility] Pager [phone] till 3 pm If no answer page [phone]  34 year old male with morbid obesity presenting with mixed hypoxic  and hypercarbic respiratory failure due to morbid obesity and fluid overload.  I reviewed the CXR myself and poor quality but evidence of hypoinflation, basilar atelectasis and some vascular congestion.  Hypercarbic/hypoxic respiratory failure: Mandatory BiPAP QHS, supplemental O2 as ordered and continue active diureses.  Diastolic CHF: due to morbid obesity.  Will continue diureses as ordered and will need significant weight loss.  OHV: due to morbid obesity, advised regarding wt loss.  Morbid obesity: is the cause of all above problems.  Dietician to see and advised regarding weight loss.  Alyson Reedy, M.D. [facility] Pulmonary/Critical Care Medicine. Pager: [phone]. After hours pager: [phone]  08/22/2014, 10:06 AM

## 2014-08-27 ENCOUNTER — Ambulatory Visit (INDEPENDENT_AMBULATORY_CARE_PROVIDER_SITE_OTHER): Payer: 59 | Admitting: Internal Medicine

## 2014-08-27 ENCOUNTER — Encounter: Payer: Self-pay | Admitting: Internal Medicine

## 2014-08-27 VITALS — BP 118/68 | HR 96 | Temp 97.7°F | Resp 16 | Ht 73.0 in | Wt >= 6400 oz

## 2014-08-27 DIAGNOSIS — E662 Morbid (severe) obesity with alveolar hypoventilation: Secondary | ICD-10-CM | POA: Diagnosis not present

## 2014-08-27 DIAGNOSIS — I10 Essential (primary) hypertension: Secondary | ICD-10-CM | POA: Diagnosis not present

## 2014-08-27 NOTE — Progress Notes (Signed)
Pre visit review using our clinic review tool, if applicable. No additional management support is needed unless otherwise documented below in the visit note. 

## 2014-08-27 NOTE — Assessment & Plan Note (Signed)
His BP is well controlled Recent lytes and renal function are stable

## 2014-08-27 NOTE — Patient Instructions (Signed)

## 2014-08-27 NOTE — Assessment & Plan Note (Signed)
Improvement noted s/p admission CPAP has been provided and he is using it He is using O2 continuously during the day

## 2014-08-27 NOTE — Progress Notes (Signed)
   Subjective:    Patient ID: Tony GuestWilliam D Graves, male    DOB: 08/16/1980, 34 y.o.   MRN: [id number]  HPI Comments: He returns for f/up after a recent admission for resp failure, he is on O2 now and is feeling much better. He has willfully lost 10 pounds.     Review of Systems  Constitutional: Positive for fatigue. Negative for fever, chills, diaphoresis and appetite change.  HENT: Negative.   Eyes: Negative.   Respiratory: Positive for apnea and shortness of breath. Negative for cough, choking, chest tightness, wheezing and stridor.   Cardiovascular: Positive for leg swelling. Negative for chest pain and palpitations.  Gastrointestinal: Negative.  Negative for nausea, vomiting, abdominal pain, diarrhea, constipation and blood in stool.  Endocrine: Negative.   Genitourinary: Negative.   Musculoskeletal: Negative.  Negative for myalgias, back pain, joint swelling, arthralgias and neck pain.  Skin: Negative.   Allergic/Immunologic: Negative.   Neurological: Negative.   Hematological: Negative.  Negative for adenopathy. Does not bruise/bleed easily.  Psychiatric/Behavioral: Negative.        Objective:   Physical Exam  Constitutional: He is oriented to person, place, and time. No distress.  HENT:  Mouth/Throat: Oropharynx is clear and moist. No oropharyngeal exudate.  Eyes: Conjunctivae are normal. Right eye exhibits no discharge. Left eye exhibits no discharge. No scleral icterus.  Neck: Normal range of motion. Neck supple. No JVD present. No tracheal deviation present. No thyromegaly present.  Cardiovascular: Normal rate, regular rhythm, normal heart sounds and intact distal pulses.  Exam reveals no gallop and no friction rub.   No murmur heard. Pulmonary/Chest: Effort normal and breath sounds normal. No stridor. No respiratory distress. He has no wheezes. He has no rales. He exhibits no tenderness.  Abdominal: Soft. Bowel sounds are normal. He exhibits no distension and no mass. There  is no tenderness. There is no rebound and no guarding.  Musculoskeletal: Normal range of motion. He exhibits edema. He exhibits no tenderness.  Lymphadenopathy:    He has no cervical adenopathy.  Neurological: He is oriented to person, place, and time.  Skin: Skin is warm and dry. No rash noted. He is not diaphoretic. No erythema. No pallor.  Vitals reviewed.    Lab Results  Component Value Date   WBC 5.7 08/21/2014   HGB 16.1 08/21/2014   HCT 52.1* 08/21/2014   PLT 150 08/21/2014   GLUCOSE 108* 08/21/2014   CHOL 115 10/08/2013   TRIG 98.0 10/08/2013   HDL 28.50* 10/08/2013   LDLCALC 67 10/08/2013   ALT 39 08/21/2014   AST 39* 08/21/2014   NA 137 08/21/2014   K 3.9 08/21/2014   CL 88* 08/21/2014   CREATININE 0.72 08/21/2014   BUN 9 08/21/2014   CO2 43* 08/21/2014   TSH 2.92 05/27/2014   INR 1.10 08/18/2014   HGBA1C 6.4 08/07/2014   MICROALBUR 1.49 08/25/2008       Assessment & Plan:

## 2014-08-29 DIAGNOSIS — L97821 Non-pressure chronic ulcer of other part of left lower leg limited to breakdown of skin: Secondary | ICD-10-CM | POA: Diagnosis not present

## 2014-08-29 DIAGNOSIS — I87333 Chronic venous hypertension (idiopathic) with ulcer and inflammation of bilateral lower extremity: Secondary | ICD-10-CM | POA: Diagnosis not present

## 2014-08-29 DIAGNOSIS — L97811 Non-pressure chronic ulcer of other part of right lower leg limited to breakdown of skin: Secondary | ICD-10-CM | POA: Diagnosis not present

## 2014-08-29 DIAGNOSIS — B964 Proteus (mirabilis) (morganii) as the cause of diseases classified elsewhere: Secondary | ICD-10-CM | POA: Diagnosis not present

## 2014-08-29 DIAGNOSIS — L0889 Other specified local infections of the skin and subcutaneous tissue: Secondary | ICD-10-CM | POA: Diagnosis not present

## 2014-08-29 DIAGNOSIS — I890 Lymphedema, not elsewhere classified: Secondary | ICD-10-CM | POA: Diagnosis not present

## 2014-09-18 ENCOUNTER — Ambulatory Visit: Payer: 59 | Admitting: Dietician

## 2014-09-19 ENCOUNTER — Encounter: Payer: 59 | Attending: Pulmonary Disease | Admitting: Dietician

## 2014-09-19 DIAGNOSIS — Z713 Dietary counseling and surveillance: Secondary | ICD-10-CM | POA: Diagnosis not present

## 2014-09-19 DIAGNOSIS — R7309 Other abnormal glucose: Secondary | ICD-10-CM | POA: Diagnosis not present

## 2014-09-19 DIAGNOSIS — Z6841 Body Mass Index (BMI) 40.0 and over, adult: Secondary | ICD-10-CM | POA: Diagnosis not present

## 2014-09-19 NOTE — Progress Notes (Signed)
Medical Nutrition Therapy:  Appt start time: 1145 end time:  1200.   Assessment:  2/11: Primary concerns today: Patient wants to improve health and do what is needed to lose weight. He is here with his mother.   Hx includes obstructive sleep apnea with obesity hypoventilation syndrome.  BMI of 60.1.    Patient is to begin using BiPAP.  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  HgbA1C of 6.4 05/27/14.  He works at [employer] 3rd shift.  Has been thinking about going on short term disability to work on improving health.  Patient is here with mother.  He does his own shopping and both cook.  07/08/14: Patient is here with his mother.  Weight today is 450 lbs. He is using Mrs. Dash, eating more fruit, greek yogurt, using egg whites making sandwiches at home rather than fast food or eating subway, decreased fried foods.  He is not started the BiPAP yet due to insurance issues.  States that he is feeling more energetic.  Patient is still contemplating what to do with his job.  Patient is being treated for wounds on feet for the past 1-2 years.  Patient states that he has lymphadenia and gets wrap changed a couple times per week.  States that it is now healing.  Weight has decreased 14 lbs in the last month.  Patient states that he is considering Bariatric surgery.  08/14/14: Patient is here along.  He has gained another pound up to 457 lbs.  He reports that he has continued to avoid fried foods.  He is still working and is still considering disability.  He states that he has decided to try Bariatric surgery.  He continues to have problems with insurance and still does not have the C-pap. States that 1 wound is healing but more have opened on the other leg.  Some parts of diet remain high in refined sugar.  Challenge affording healthy food.  Lives with mother.  HgbA1C remains stable at 6.4% 08/07/14.  09/19/14: Patient comes in today wearing oxygen.  States that he  only requires this when he is out.  He was hospitalized with pneumonia after last visit.  He is now using C-pap at night and has not been able to return to work.  He is trying to get disability.  Lives with mother.  States that it is difficult to eat healthfully.  Weight is increased from 457 to 460 lbs today.  He is still considering Bariatric surgery but was not able to make it to the last information class.  Phone number provided to register for future class.    Wt Readings from Last 3 Encounters:  08/27/14 445 lb (201.851 kg)  08/19/14 455 lb (206.387 kg)  08/14/14 457 lb (207.294 kg)    Preferred Learning Style:   No preference indicated   Learning Readiness:   Ready  Change in progress   MEDICATIONS: see list.  Unable to reconcile as patient did not bring current list.   DIETARY INTAKE: Usual eating pattern includes 2-3 meals and 2 snacks per day.  24-hr recall:   B (8am):  [place] sandwich with milk Snack:  Greek yogurt L (1pm):  Sandwich D (8pm):  Spinach with cheese and fish sticks Snacks while at work:  [place] yogurt, quaker oat granola bars (strawberry and yogurt) Beverages:  Water, 50/50 juice, regular soda occasionally  Estimated energy needs: 2100 calories 235 g carbohydrates 158 g protein 58 g fat  Progress Towards Goal(s):  In progress.   Nutritional Diagnosis:  NB-1.1 Food and nutrition-related knowledge deficit As related to balance of carbohydrates, protein, fat and nutrition related to pre diabetes and weight control.  As evidenced by patient report and diet hx..    Intervention:  Nutrition Counseling. Information about food pantries and free options in [place] provided.  Patient to look into applying for food stamps.  Pt to call [phone] register for Bariatric surgery information class.    Teaching Method Utilized:  Visual Auditory  Barriers to learning/adherence to lifestyle change: overall health  Demonstrated degree of understanding via:   Teach Back   Monitoring/Evaluation:  Dietary intake, exercise, label reading, and body weight prn.

## 2014-09-25 ENCOUNTER — Encounter: Payer: Self-pay | Admitting: Pulmonary Disease

## 2014-09-25 ENCOUNTER — Ambulatory Visit (INDEPENDENT_AMBULATORY_CARE_PROVIDER_SITE_OTHER): Payer: 59 | Admitting: Pulmonary Disease

## 2014-09-25 VITALS — BP 134/80 | HR 88 | Ht 73.0 in | Wt >= 6400 oz

## 2014-09-25 DIAGNOSIS — E662 Morbid (severe) obesity with alveolar hypoventilation: Secondary | ICD-10-CM | POA: Diagnosis not present

## 2014-09-25 DIAGNOSIS — Z6841 Body Mass Index (BMI) 40.0 and over, adult: Secondary | ICD-10-CM

## 2014-09-25 DIAGNOSIS — G4733 Obstructive sleep apnea (adult) (pediatric): Secondary | ICD-10-CM

## 2014-09-25 NOTE — Patient Instructions (Signed)
Will change BiPAP settings and try to arrange for more portable home oxygen set up Follow up in 6 months

## 2014-09-25 NOTE — Progress Notes (Signed)
Chief Complaint  Patient presents with  . Follow-up    Wears CPAP nightly. Denies any issues with mask/pressure. Pt recently got new mask- using nasal mask.     History of Present Illness: Tony Mccullough is a 34 y.o. male with OSA and OHS.  Since his last visit he was in hospital for hypoxic/hypercapnic respiratory failure.  He was sent home with BiPAP 12/5 >> his sleep study showed he needed 21/18.  He can only use his BiPAP for about 2 to 3 hours.  He feels like he can't breath, and then wakes up.  He was seen by dietician recently.  His finances are limited since he is no longer working.  He is applying for disability and food stamps.  His SpO2 was 84% on room air today.  TESTS: 2011 >> Tracheostomy decannulation Echo 12/03/13 >> mild LVH, EF 60 to 65%, mild RA dilation Split 03/02/14 >> AHI 107.5, SaO2 low 60%. BiPAP 21/18 cm H2O with 3 liters oxygen >> AHI 0.    PMHx >> HTN, Diastolic heart failure, lymph edema lower extremities  PSHx, Medications, Allergies, Fhx, Shx reviewed.  Physical Exam: Blood pressure 134/80, pulse 88, height 6\' 1"  (1.854 m), weight 451 lb 12.8 oz (204.935 kg), SpO2 91 %. Body mass index is 59.62 kg/(m^2).  General - No distress, morbid obesity, foul smell ENT - No sinus tenderness, no oral exudate, no LAN, MP 4, tracheostomy stoma scare well healed, wears glasses Cardiac - s1s2 regular, no murmur Chest - No wheeze/rales/dullness Back - No focal tenderness Abd - Soft, non-tender Ext - 3+ lymphedema, with b/l lower extremities in wrap Neuro - Normal strength Skin - No rashes Psych - normal mood, and behavior  Assessment/Plan:  Obstructive sleep apnea. Plan: - will change to auto BiPAP with range 5 to 25 cm H2O  Obesity hypoventilation syndrome. Plan: - he needs 2 liters oxygen during the day, and 3 liters oxygen at night with BiPAP - will try to arrange for more portable oxygen set up  Morbid obesity. Plan: - f/u with  dietician   Tony HellingVineet Malik Ruffino, MD  Pulmonary/Critical Care/Sleep Pager:  [phone]

## 2014-10-02 ENCOUNTER — Telehealth: Payer: Self-pay | Admitting: Pulmonary Disease

## 2014-10-02 DIAGNOSIS — Z029 Encounter for administrative examinations, unspecified: Secondary | ICD-10-CM

## 2014-10-02 NOTE — Telephone Encounter (Signed)
Spoke with pt, states that he wanted an order for a more portable 02 tank and not a poc.    [name], spoke with Lupita LeashDonna and explained the situation, states that this would require an order for a conserving device.  Lupita LeashDonna is faxing order forms over to main office fax # to Ashtyn's attn.  Will forward to Ashtyn to look out for.

## 2014-10-08 NOTE — Telephone Encounter (Signed)
Form completed.

## 2014-10-08 NOTE — Telephone Encounter (Signed)
Form faxed back to [facility] [phone] [facility] [name] and spoke with [name] - aware that form is being faxed, she is going to let Lupita LeashDonna know.  Pt aware also.  Nothing further needed.

## 2014-10-08 NOTE — Telephone Encounter (Signed)
This has been received and given to Dr Craige CottaSood to address.

## 2014-10-08 NOTE — Telephone Encounter (Signed)
Has this been received? Thanks. 

## 2014-10-28 ENCOUNTER — Ambulatory Visit (INDEPENDENT_AMBULATORY_CARE_PROVIDER_SITE_OTHER): Payer: 59 | Admitting: Internal Medicine

## 2014-10-28 ENCOUNTER — Encounter: Payer: Self-pay | Admitting: Internal Medicine

## 2014-10-28 VITALS — BP 124/70 | HR 90 | Temp 98.6°F | Resp 16 | Ht 73.0 in | Wt >= 6400 oz

## 2014-10-28 DIAGNOSIS — L97929 Non-pressure chronic ulcer of unspecified part of left lower leg with unspecified severity: Secondary | ICD-10-CM

## 2014-10-28 DIAGNOSIS — L97919 Non-pressure chronic ulcer of unspecified part of right lower leg with unspecified severity: Secondary | ICD-10-CM

## 2014-10-28 DIAGNOSIS — I83019 Varicose veins of right lower extremity with ulcer of unspecified site: Secondary | ICD-10-CM

## 2014-10-28 DIAGNOSIS — I10 Essential (primary) hypertension: Secondary | ICD-10-CM

## 2014-10-28 DIAGNOSIS — I83029 Varicose veins of left lower extremity with ulcer of unspecified site: Secondary | ICD-10-CM

## 2014-10-28 DIAGNOSIS — I872 Venous insufficiency (chronic) (peripheral): Secondary | ICD-10-CM | POA: Diagnosis not present

## 2014-10-28 DIAGNOSIS — R609 Edema, unspecified: Secondary | ICD-10-CM

## 2014-10-28 MED ORDER — TORSEMIDE 20 MG PO TABS: 20.0000 mg | ORAL_TABLET | Freq: Every day | ORAL | Status: DC

## 2014-10-28 NOTE — Patient Instructions (Signed)

## 2014-10-28 NOTE — Progress Notes (Signed)
Subjective:  Patient ID: Tony Mccullough, male    DOB: 25-Jul-1980  Age: 34 y.o. MRN: [id number]  CC: Hypertension   HPI Tony Mccullough presents for a BP check - he offers no new complaints, the leg edema is some better but SOB is unchanged tough he tells me that continuous oxygen has helped.  Outpatient Prescriptions Prior to Visit  Medication Sig Dispense Refill  . aspirin EC 325 MG EC tablet Take 1 tablet (325 mg total) by mouth daily. 30 tablet 0  . fluticasone (FLONASE) 50 MCG/ACT nasal spray Place 2 sprays into both nostrils daily. 16 g 2  . torsemide (DEMADEX) 20 MG tablet Take 1 tablet (20 mg total) by mouth daily. 30 tablet 5  . Azilsartan Medoxomil (EDARBI) 40 MG TABS Take 1 tablet by mouth daily. (Patient not taking: Reported on 10/28/2014) 30 tablet 11  . loratadine (CLARITIN) 10 MG tablet Take 1 tablet (10 mg total) by mouth daily. (Patient not taking: Reported on 09/25/2014) 30 tablet 2  . traMADol (ULTRAM) 50 MG tablet Take 1 tablet (50 mg total) by mouth every 6 (six) hours as needed for moderate pain. (Patient not taking: Reported on 09/25/2014) 60 tablet 0   No facility-administered medications prior to visit.    ROS Review of Systems  Constitutional: Positive for fatigue. Negative for fever, chills, diaphoresis, activity change, appetite change and unexpected weight change.  HENT: Negative.  Negative for trouble swallowing.   Eyes: Negative.   Respiratory: Positive for shortness of breath. Negative for apnea, cough, choking, chest tightness, wheezing and stridor.   Cardiovascular: Positive for leg swelling. Negative for chest pain and palpitations.  Gastrointestinal: Negative.  Negative for nausea, vomiting, abdominal pain, diarrhea, constipation and blood in stool.  Endocrine: Negative.   Genitourinary: Negative.  Negative for urgency, frequency, decreased urine volume and difficulty urinating.  Musculoskeletal: Negative.   Skin: Negative.   Allergic/Immunologic:  Negative.   Neurological: Negative.  Negative for dizziness, tremors, syncope, light-headedness, numbness and headaches.  Hematological: Negative.     Objective:  BP 124/70 mmHg  Pulse 90  Temp(Src) 98.6 F (37 C) (Oral)  Resp 16  Ht  (1.854 m)  Wt 440 lb 12 oz (199.923 kg)  BMI 58.16 kg/m2  SpO2 97%  BP Readings from Last 3 Encounters:  10/28/14 124/70  09/25/14 134/80  08/27/14 118/68    Wt Readings from Last 3 Encounters:  10/28/14 440 lb 12 oz (199.923 kg)  09/25/14 451 lb 12.8 oz (204.935 kg)  09/19/14 460 lb (208.655 kg)    Physical Exam  Constitutional: He is oriented to person, place, and time. He appears well-developed and well-nourished. No distress.  HENT:  Head: Normocephalic and atraumatic.  Mouth/Throat: Oropharynx is clear and moist. No oropharyngeal exudate.  Eyes: Conjunctivae are normal. Right eye exhibits no discharge. Left eye exhibits no discharge. No scleral icterus.  Neck: Normal range of motion. Neck supple. No JVD present. No tracheal deviation present. No thyromegaly present.  Cardiovascular: Normal rate, regular rhythm, normal heart sounds and intact distal pulses.  Exam reveals no gallop and no friction rub.   No murmur heard. Pulmonary/Chest: Effort normal and breath sounds normal. No stridor. No respiratory distress. He has no wheezes. He has no rales. He exhibits no tenderness.  Abdominal: Soft. Bowel sounds are normal. He exhibits no distension and no mass. There is no tenderness. There is no rebound and no guarding.  Musculoskeletal: Normal range of motion. He exhibits edema (1+ non-pitting edema  in BLE). He exhibits no tenderness.  Lymphadenopathy:    He has no cervical adenopathy.  Neurological: He is oriented to person, place, and time.  Skin: Skin is warm and dry. No rash noted. He is not diaphoretic. No erythema. No pallor.  Psychiatric: He has a normal mood and affect. His behavior is normal. Judgment and thought content normal.      Lab Results  Component Value Date   WBC 5.7 08/21/2014   HGB 16.1 08/21/2014   HCT 52.1* 08/21/2014   PLT 150 08/21/2014   GLUCOSE 108* 08/21/2014   CHOL 115 10/08/2013   TRIG 98.0 10/08/2013   HDL 28.50* 10/08/2013   LDLCALC 67 10/08/2013   ALT 39 08/21/2014   AST 39* 08/21/2014   NA 137 08/21/2014   K 3.9 08/21/2014   CL 88* 08/21/2014   CREATININE 0.72 08/21/2014   BUN 9 08/21/2014   CO2 43* 08/21/2014   TSH 2.92 05/27/2014   INR 1.10 08/18/2014   HGBA1C 6.4 08/07/2014   MICROALBUR 1.49 08/25/2008    Dg Chest 2 View  08/19/2014   CLINICAL DATA:  Shortness of breath  EXAM: CHEST  2 VIEW  COMPARISON:  08/01/2013  FINDINGS: Mild cardiomegaly and congestive interstitial coarsening without overt edema. Stable aortic and hilar contours. No pneumonia, effusion, or pneumothorax.  IMPRESSION: Cardiomegaly and pulmonary venous congestion.   Electronically Signed   By: Marnee Spring M.D.   On: 08/19/2014 01:29   Ct Angio Chest Pe W/cm &/or Wo Cm  08/19/2014   CLINICAL DATA:  Unspecified chest pain.  EXAM: CT ANGIOGRAPHY CHEST WITH CONTRAST  TECHNIQUE: Multidetector CT imaging of the chest was performed using the standard protocol during bolus administration of intravenous contrast. Multiplanar CT image reconstructions and MIPs were obtained to evaluate the vascular anatomy.  CONTRAST:  OMNIPAQUE IOHEXOL 350 MG/ML SOLN  COMPARISON:  None.  FINDINGS: THORACIC INLET/BODY WALL:  No acute abnormality.  MEDIASTINUM:  Normal heart size.  No pericardial effusion.  CTA of the pulmonary arteries is limited by patient size and intermittent motion, with the potential to miss segmental or smaller pulmonary embolism. There is no convincing pulmonary artery filling defect. No aortic dissection.  LUNG WINDOWS:  Mild atelectasis.  No consolidation or edema.  No pleural effusion.  UPPER ABDOMEN:  No acute findings.  OSSEOUS:  No acute fracture.  No suspicious lytic or blastic lesions.  Review of the  MIP images confirms the above findings.  IMPRESSION: Limited study due to patient size; no evidence of pulmonary embolism.   Electronically Signed   By: Marnee Spring M.D.   On: 08/19/2014 03:03    Assessment & Plan:   Tony Mccullough was seen today for hypertension.  Diagnoses and all orders for this visit:  Essential hypertension, benign - he has stopped taking the ARB and his BP is still well controlled, will cont the diuretic, will monitor his lytes and renal function today Orders: -     torsemide (DEMADEX) 20 MG tablet; Take 1 tablet (20 mg total) by mouth daily. -     Basic metabolic panel; Future  Venous stasis ulcers of both lower extremities - improvement noted, he will cont to f/up with the wound clinic Orders: -     torsemide (DEMADEX) 20 MG tablet; Take 1 tablet (20 mg total) by mouth daily.  Edema   I have discontinued Mr. Shadowens Azilsartan Medoxomil, traMADol, and loratadine. I am also having him maintain his aspirin, fluticasone, and torsemide.  Meds ordered  this encounter  Medications  . torsemide (DEMADEX) 20 MG tablet    Sig: Take 1 tablet (20 mg total) by mouth daily.    Dispense:  30 tablet    Refill:  5     Follow-up: No Follow-up on file.  Sanda Linger, MD

## 2014-10-28 NOTE — Progress Notes (Signed)
Pre visit review using our clinic review tool, if applicable. No additional management support is needed unless otherwise documented below in the visit note. 

## 2014-12-09 ENCOUNTER — Telehealth: Payer: Self-pay | Admitting: Pulmonary Disease

## 2014-12-09 NOTE — Telephone Encounter (Signed)
Spoke with Renee at [employer] notes from last OV in order to process order placed at that visit.  OV notes have been faxed to [phone] Nothing further needed.

## 2014-12-15 ENCOUNTER — Telehealth: Payer: Self-pay | Admitting: Pulmonary Disease

## 2014-12-15 NOTE — Telephone Encounter (Signed)
Spoke with patient; aware that VS will not be back until Monday 12-22-14. Pt states he will contact his PCP in the meantime to try and get letter to help him keep EBT assistance. If patient is able to go through his PCP he will contact our office so we can close the phone note.   Otherwise he will wait for VS's return to see if he will write a letter stating patient is unable to work at this time.

## 2014-12-22 ENCOUNTER — Encounter: Payer: Self-pay | Admitting: Pulmonary Disease

## 2014-12-22 NOTE — Telephone Encounter (Signed)
Patient says that he did not get in touch with his PCP.  He wants Dr. Craige Cotta to write a letter for him saying he is unable to work so that he can get his food stamps re-instated.  Patient scheduled to see Dr. Craige Cotta on 12/24/2014 for CPAP Compliance follow up.

## 2014-12-22 NOTE — Telephone Encounter (Signed)
Letter completed.

## 2014-12-22 NOTE — Telephone Encounter (Signed)
Spoke with pt to make aware, states he will pick up this letter at his appt on Wednesday.  Nothing further needed.

## 2014-12-24 ENCOUNTER — Encounter: Payer: Self-pay | Admitting: Pulmonary Disease

## 2014-12-24 ENCOUNTER — Ambulatory Visit (INDEPENDENT_AMBULATORY_CARE_PROVIDER_SITE_OTHER): Payer: 59 | Admitting: Pulmonary Disease

## 2014-12-24 VITALS — BP 138/80 | HR 78 | Temp 98.2°F | Ht 73.0 in | Wt >= 6400 oz

## 2014-12-24 DIAGNOSIS — J9611 Chronic respiratory failure with hypoxia: Secondary | ICD-10-CM

## 2014-12-24 DIAGNOSIS — G4733 Obstructive sleep apnea (adult) (pediatric): Secondary | ICD-10-CM

## 2014-12-24 DIAGNOSIS — E662 Morbid (severe) obesity with alveolar hypoventilation: Secondary | ICD-10-CM | POA: Diagnosis not present

## 2014-12-24 DIAGNOSIS — Z6841 Body Mass Index (BMI) 40.0 and over, adult: Secondary | ICD-10-CM

## 2014-12-24 NOTE — Progress Notes (Addendum)
Chief Complaint  Patient presents with  . Follow-up    pt tolerating bipap well, wears nightly 6-8 hours per night.      History of Present Illness: Tony Mccullough is a 34 y.o. male with OSA and OHS.  He has been sleeping better with BiPAP.  He is using 2 liters oxygen during day and 3 liters at night.  He has nasal mask.  No issues with mask fit.  He gets occasional mouth dryness.    He has been doing better with his diet.  He has lost 30 lbs since May.  He is working with a Clinical research associate for disability application.  He has noticed more irritation in his leg wounds.  He has not been back to wound care center recently.   TESTS: 2011 >> Tracheostomy decannulation Echo 12/03/13 >> mild LVH, EF 60 to 65%, mild RA dilation Split 03/02/14 >> AHI 107.5, SaO2 low 60%. BiPAP 21/18 cm H2O with 3 liters oxygen >> AHI 0.  CT chest 08/19/14 >> no PE Auto BiPAP 11/23/14 to 12/22/14 >> used on 29 of 30 nights with average 6 hrs and 12 min.  Average AHI is 2.6 with median BiPAP 11/7 cm H2O, and 95 th percentile CPAP 13/9 cm H2O, PS 4 cm H2O.   PMHx >> HTN, Diastolic heart failure, lymph edema lower extremities  PSHx, Medications, Allergies, Fhx, Shx reviewed.  Physical Exam: BP 138/80 mmHg  Pulse 78  Temp(Src) 98.2 F (36.8 C) (Oral)  Ht  (1.854 m)  Wt 431 lb (195.5 kg)  BMI 56.88 kg/m2  SpO2 95%  General - No distress, morbid obesity, foul smell ENT - No sinus tenderness, no oral exudate, no LAN, MP 4, tracheostomy stoma scare well healed, wears glasses Cardiac - s1s2 regular, no murmur Chest - No wheeze/rales/dullness Back - No focal tenderness Abd - Soft, non-tender Ext - 3+ lymphedema, with b/l lower extremities in wrap Neuro - Normal strength Skin - No rashes Psych - normal mood, and behavior  Wt Readings from Last 5 Encounters:  12/24/14 431 lb (195.5 kg)  10/28/14 440 lb 12 oz (199.923 kg)  09/25/14 451 lb 12.8 oz (204.935 kg)  09/19/14 460 lb (208.655 kg)  08/27/14 445  lb (201.851 kg)     Assessment/Plan:  Obstructive sleep apnea. He is compliant with BiPAP and reports benefit. Plan: - continue auto BiPAP with range 5 to 25 cm H2O  Chronic hypoxic respiratory failure secondary to obesity hypoventilation syndrome. Plan: - continue 2 liters oxygen during the day, and 3 liters oxygen at night with BiPAP  Morbid obesity. Plan: - continue weight loss efforts  Venous stasis ulcers on legs. Plan: - he is to f/u with PCP and wound care  Provided him with letter to assist with getting re-approved for food stamps.  He is applying for disability.   Coralyn Helling, MD [place] [facility]monary/Critical Care/Sleep Pager:  [phone]

## 2014-12-24 NOTE — Patient Instructions (Signed)
Follow up in 6 months 

## 2015-06-04 ENCOUNTER — Telehealth: Payer: Self-pay | Admitting: Pulmonary Disease

## 2015-06-04 NOTE — Telephone Encounter (Signed)
Will hold in my box, waiting on form

## 2015-06-09 NOTE — Telephone Encounter (Signed)
ashtyn please advise if this form has been handled.  I don't know what I'm looking for in VS' look-at.  Thanks!

## 2015-06-11 NOTE — Telephone Encounter (Signed)
Spoke with Weston Brass at [employer] that it is a form regarding O2 use, order form to continue O2 (possibly similar to a CMN) Will hold in box to check if received before I leave today so that Dr Craige Cotta can sign while in office this PM.

## 2015-06-12 NOTE — Telephone Encounter (Signed)
Form received and signed by VS - faxed back to [phone] Spoke with Weston Brass, aware that this has been faxed back.  Form was received. Nothing further needed.

## 2015-06-26 ENCOUNTER — Ambulatory Visit (INDEPENDENT_AMBULATORY_CARE_PROVIDER_SITE_OTHER): Payer: Self-pay | Admitting: Pulmonary Disease

## 2015-06-26 ENCOUNTER — Encounter: Payer: Self-pay | Admitting: Pulmonary Disease

## 2015-06-26 VITALS — BP 124/82 | HR 78 | Ht 73.0 in | Wt >= 6400 oz

## 2015-06-26 DIAGNOSIS — G4733 Obstructive sleep apnea (adult) (pediatric): Secondary | ICD-10-CM

## 2015-06-26 DIAGNOSIS — Z6841 Body Mass Index (BMI) 40.0 and over, adult: Secondary | ICD-10-CM

## 2015-06-26 DIAGNOSIS — E662 Morbid (severe) obesity with alveolar hypoventilation: Secondary | ICD-10-CM

## 2015-06-26 DIAGNOSIS — J9611 Chronic respiratory failure with hypoxia: Secondary | ICD-10-CM

## 2015-06-26 NOTE — Patient Instructions (Signed)
Follow up in 1 year.

## 2015-06-26 NOTE — Progress Notes (Signed)
Current Outpatient Prescriptions on File Prior to Visit  Medication Sig  . aspirin EC 325 MG EC tablet Take 1 tablet (325 mg total) by mouth daily.  . fluticasone (FLONASE) 50 MCG/ACT nasal spray Place 2 sprays into both nostrils daily. (Patient taking differently: Place 2 sprays into both nostrils daily as needed. )  . torsemide (DEMADEX) 20 MG tablet Take 1 tablet (20 mg total) by mouth daily.   No current facility-administered medications on file prior to visit.     Chief Complaint  Patient presents with  . Follow-up    Still using O2 24/7 - 2 liters with exertion and 3 liters bled into BiPAP. Wears BiPAP nightly. Denies problems with mask or pressure.      Tests 2011 >> Tracheostomy decannulation Echo 12/03/13 >> mild LVH, EF 60 to 65%, mild RA dilation Split 03/02/14 >> AHI 107.5, SaO2 low 60%. BiPAP 21/18 cm H2O with 3 liters oxygen >> AHI 0.  CT chest 08/19/14 >> no PE Auto BiPAP 05/25/15 to 06/23/15 >> used on 29 of 30 nights with average 5 hrs 47 min.  Average AHI 3.2 with median BiPAP 11/7 cm H2O and 95 th percentile BiPAP 14/10 cm H2O  Past medical hx HTN, Diastolic heart failure, lymph edema lower extremities  Past surgical hx, Allergies, Family hx, Social hx all reviewed.  Vital Signs BP 124/82 mmHg  Pulse 78  Ht  (1.854 m)  Wt 424 lb (192.325 kg)  BMI 55.95 kg/m2  SpO2 97%  History of Present Illness Tony Mccullough is a 35 y.o. male with with OSA and OHS.  He has been using BiPAP on regular basis.  No issue with mask fit.  He is using oxygen 24/7.  His legs were better, and he was d/c from wound care >> started getting more irritation and smell recently.  He is in process of applying for disability/medicaid.  He tries to stay active.  He lives with his mother.  Physical Exam  General - No distress, wearing oxygen ENT - No sinus tenderness, no oral exudate, no LAN, MP 3 Cardiac - s1s2 regular, no murmur Chest - No wheeze/rales/dullness Back - No  focal tenderness Abd - Soft, non-tender Ext - 3+ edema, legs in wrap, foul smell Neuro - Normal strength Skin - No rashes Psych - normal mood, and behavior  Wt Readings from Last 3 Encounters:  06/26/15 424 lb (192.325 kg)  12/24/14 431 lb (195.5 kg)  10/28/14 440 lb 12 oz (199.923 kg)    Assessment/Plan  Obstructive sleep apnea. He is compliant with BiPAP and reports benefit. Plan: - continue auto BiPAP with range 5 to 25 cm H2O  Chronic hypoxic respiratory failure secondary to obesity hypoventilation syndrome. Plan: - continue 2 liters oxygen during the day, and 3 liters oxygen at night with BiPAP  Morbid obesity. Plan: - continue weight loss efforts  Venous stasis ulcers on legs. Plan: - advised him to d/w PCP about whether he needs to return to wound care   Patient Instructions  Follow up in 1 year     Coralyn Helling, MD [place] [facility]monary/Critical Care/Sleep Pager:  [phone]

## 2015-07-24 ENCOUNTER — Ambulatory Visit (INDEPENDENT_AMBULATORY_CARE_PROVIDER_SITE_OTHER): Payer: Self-pay

## 2015-07-24 DIAGNOSIS — Z23 Encounter for immunization: Secondary | ICD-10-CM

## 2015-09-09 ENCOUNTER — Ambulatory Visit (INDEPENDENT_AMBULATORY_CARE_PROVIDER_SITE_OTHER): Payer: Medicaid Other | Admitting: Internal Medicine

## 2015-09-09 ENCOUNTER — Encounter: Payer: Self-pay | Admitting: Internal Medicine

## 2015-09-09 ENCOUNTER — Other Ambulatory Visit (INDEPENDENT_AMBULATORY_CARE_PROVIDER_SITE_OTHER): Payer: Medicaid Other

## 2015-09-09 ENCOUNTER — Ambulatory Visit: Payer: Self-pay | Admitting: Internal Medicine

## 2015-09-09 VITALS — BP 120/80 | HR 83 | Temp 98.5°F | Resp 16 | Ht 73.0 in | Wt >= 6400 oz

## 2015-09-09 DIAGNOSIS — A09 Infectious gastroenteritis and colitis, unspecified: Secondary | ICD-10-CM

## 2015-09-09 DIAGNOSIS — R739 Hyperglycemia, unspecified: Secondary | ICD-10-CM | POA: Diagnosis not present

## 2015-09-09 DIAGNOSIS — I10 Essential (primary) hypertension: Secondary | ICD-10-CM

## 2015-09-09 DIAGNOSIS — R197 Diarrhea, unspecified: Secondary | ICD-10-CM

## 2015-09-09 DIAGNOSIS — R778 Other specified abnormalities of plasma proteins: Secondary | ICD-10-CM

## 2015-09-09 LAB — CBC WITH DIFFERENTIAL/PLATELET
BASOS PCT: 0.2 % (ref 0.0–3.0)
Basophils Absolute: 0 10*3/uL (ref 0.0–0.1)
EOS ABS: 0.1 10*3/uL (ref 0.0–0.7)
Eosinophils Relative: 2.7 % (ref 0.0–5.0)
HEMATOCRIT: 43.8 % (ref 39.0–52.0)
Hemoglobin: 15.1 g/dL (ref 13.0–17.0)
LYMPHS PCT: 23.4 % (ref 12.0–46.0)
Lymphs Abs: 1.3 10*3/uL (ref 0.7–4.0)
MCHC: 34.4 g/dL (ref 30.0–36.0)
MCV: 95.5 fl (ref 78.0–100.0)
Monocytes Absolute: 1.3 10*3/uL — ABNORMAL HIGH (ref 0.1–1.0)
Monocytes Relative: 23.6 % — ABNORMAL HIGH (ref 3.0–12.0)
Neutro Abs: 2.8 10*3/uL (ref 1.4–7.7)
Neutrophils Relative %: 50.1 % (ref 43.0–77.0)
PLATELETS: 177 10*3/uL (ref 150.0–400.0)
RBC: 4.58 Mil/uL (ref 4.22–5.81)
RDW: 15 % (ref 11.5–15.5)
WBC: 5.5 10*3/uL (ref 4.0–10.5)

## 2015-09-09 LAB — COMPREHENSIVE METABOLIC PANEL
ALT: 34 U/L (ref 0–53)
AST: 43 U/L — ABNORMAL HIGH (ref 0–37)
Albumin: 3.6 g/dL (ref 3.5–5.2)
Alkaline Phosphatase: 77 U/L (ref 39–117)
BUN: 12 mg/dL (ref 6–23)
CHLORIDE: 94 meq/L — AB (ref 96–112)
CO2: 36 meq/L — AB (ref 19–32)
CREATININE: 0.87 mg/dL (ref 0.40–1.50)
Calcium: 9.2 mg/dL (ref 8.4–10.5)
GFR: 128.67 mL/min (ref >60.00)
Glucose, Bld: 104 mg/dL — ABNORMAL HIGH (ref 70–99)
POTASSIUM: 3.6 meq/L (ref 3.5–5.1)
Sodium: 136 mEq/L (ref 135–145)
Total Bilirubin: 0.5 mg/dL (ref 0.2–1.2)
Total Protein: 9.3 g/dL — ABNORMAL HIGH (ref 6.0–8.3)

## 2015-09-09 LAB — HEMOGLOBIN A1C: HEMOGLOBIN A1C: 5.9 % (ref 4.6–6.5)

## 2015-09-09 NOTE — Patient Instructions (Signed)

## 2015-09-09 NOTE — Progress Notes (Signed)
Pre visit review using our clinic review tool, if applicable. No additional management support is needed unless otherwise documented below in the visit note. 

## 2015-09-09 NOTE — Progress Notes (Signed)
Subjective:  Patient ID: Tony Mccullough, male    DOB: Apr 17, 1981  Age: 35 y.o. MRN: [id number]  CC: Diarrhea   HPI Tony Mccullough presents for a 3 day history of diarrhea and chills. He says the diarrhea has been watery and mucousy. Initially he was having a loose stool every hour but the symptoms have gradually improved and today he has only had one bowel movement. He complains of nausea but no vomiting and chills but no fever. He denies abdominal pain, cramping, bloody stool, loss of appetite, rash, lymphadenopathy, or sore throat. He has not felt the need to take anything for the diarrhea.  Outpatient Prescriptions Prior to Visit  Medication Sig Dispense Refill  . torsemide (DEMADEX) 20 MG tablet Take 1 tablet (20 mg total) by mouth daily. 30 tablet 5  . aspirin EC 325 MG EC tablet Take 1 tablet (325 mg total) by mouth daily. (Patient not taking: Reported on 09/09/2015) 30 tablet 0  . fluticasone (FLONASE) 50 MCG/ACT nasal spray Place 2 sprays into both nostrils daily. (Patient not taking: Reported on 09/09/2015) 16 g 2   No facility-administered medications prior to visit.    ROS Review of Systems  Constitutional: Positive for chills. Negative for fever, diaphoresis, activity change, appetite change, fatigue and unexpected weight change.  HENT: Negative.  Negative for sore throat and trouble swallowing.   Eyes: Negative.   Respiratory: Negative.  Negative for cough, choking, chest tightness, shortness of breath and stridor.   Cardiovascular: Negative.  Negative for chest pain, palpitations and leg swelling.  Gastrointestinal: Positive for nausea and diarrhea. Negative for vomiting, abdominal pain, constipation, blood in stool and anal bleeding.  Endocrine: Negative.   Genitourinary: Negative.   Musculoskeletal: Negative.   Skin: Negative.   Allergic/Immunologic: Negative.   Neurological: Negative.  Negative for dizziness, tremors, weakness and light-headedness.  Hematological:  Negative.  Negative for adenopathy. Does not bruise/bleed easily.  Psychiatric/Behavioral: Negative.     Objective:  BP 120/80 mmHg  Pulse 83  Temp(Src) 98.5 F (36.9 C) (Oral)  Resp 16  Ht  (1.854 m)  Wt 420 lb (190.511 kg)  BMI 55.42 kg/m2  SpO2 95%  BP Readings from Last 3 Encounters:  09/09/15 120/80  06/26/15 124/82  12/24/14 138/80    Wt Readings from Last 3 Encounters:  09/09/15 420 lb (190.511 kg)  06/26/15 424 lb (192.325 kg)  12/24/14 431 lb (195.5 kg)    Physical Exam  Constitutional: He is oriented to person, place, and time.  Non-toxic appearance. He does not have a sickly appearance. He does not appear ill. No distress.  HENT:  Mouth/Throat: Oropharynx is clear and moist. No oropharyngeal exudate.  Eyes: Conjunctivae are normal. Right eye exhibits no discharge. Left eye exhibits no discharge. No scleral icterus.  Neck: Normal range of motion. Neck supple. No JVD present. No tracheal deviation present. No thyromegaly present.  Cardiovascular: Normal rate, regular rhythm, normal heart sounds and intact distal pulses.  Exam reveals no gallop and no friction rub.   No murmur heard. Pulmonary/Chest: Effort normal and breath sounds normal. No stridor. No respiratory distress. He has no wheezes. He has no rales. He exhibits no tenderness.  Abdominal: Soft. Bowel sounds are normal. He exhibits no distension and no mass. There is no tenderness. There is no rebound and no guarding.  Musculoskeletal: Normal range of motion. He exhibits no edema or tenderness.  Lymphadenopathy:    He has no cervical adenopathy.  Neurological: He is oriented  to person, place, and time.  Skin: Skin is warm. No rash noted. He is not diaphoretic. No erythema. No pallor.  Psychiatric: He has a normal mood and affect. His behavior is normal. Judgment and thought content normal.  Vitals reviewed.   Lab Results  Component Value Date   WBC 5.5 09/09/2015   HGB 15.1 09/09/2015   HCT  43.8 09/09/2015   PLT 177.0 09/09/2015   GLUCOSE 104* 09/09/2015   CHOL 115 10/08/2013   TRIG 98.0 10/08/2013   HDL 28.50* 10/08/2013   LDLCALC 67 10/08/2013   ALT 34 09/09/2015   AST 43* 09/09/2015   NA 136 09/09/2015   K 3.6 09/09/2015   CL 94* 09/09/2015   CREATININE 0.87 09/09/2015   BUN 12 09/09/2015   CO2 36* 09/09/2015   TSH 2.92 05/27/2014   INR 1.10 08/18/2014   HGBA1C 5.9 09/09/2015   MICROALBUR 1.49 08/25/2008    Dg Chest 2 View  08/19/2014  CLINICAL DATA:  Shortness of breath EXAM: CHEST  2 VIEW COMPARISON:  08/01/2013 FINDINGS: Mild cardiomegaly and congestive interstitial coarsening without overt edema. Stable aortic and hilar contours. No pneumonia, effusion, or pneumothorax. IMPRESSION: Cardiomegaly and pulmonary venous congestion. Electronically Signed   By: Marnee SpringJonathon  Watts M.D.   On: 08/19/2014 01:29   Ct Angio Chest Pe W/cm &/or Wo Cm  08/19/2014  CLINICAL DATA:  Unspecified chest pain. EXAM: CT ANGIOGRAPHY CHEST WITH CONTRAST TECHNIQUE: Multidetector CT imaging of the chest was performed using the standard protocol during bolus administration of intravenous contrast. Multiplanar CT image reconstructions and MIPs were obtained to evaluate the vascular anatomy. CONTRAST:  100mL OMNIPAQUE IOHEXOL 350 MG/ML SOLN COMPARISON:  None. FINDINGS: THORACIC INLET/BODY WALL: No acute abnormality. MEDIASTINUM: Normal heart size.  No pericardial effusion. CTA of the pulmonary arteries is limited by patient size and intermittent motion, with the potential to miss segmental or smaller pulmonary embolism. There is no convincing pulmonary artery filling defect. No aortic dissection. LUNG WINDOWS: Mild atelectasis.  No consolidation or edema.  No pleural effusion. UPPER ABDOMEN: No acute findings. OSSEOUS: No acute fracture.  No suspicious lytic or blastic lesions. Review of the MIP images confirms the above findings. IMPRESSION: Limited study due to patient size; no evidence of pulmonary  embolism. Electronically Signed   By: Marnee SpringJonathon  Watts M.D.   On: 08/19/2014 03:03    Assessment & Plan:   Tony Mccullough was seen today for diarrhea.  Diagnoses and all orders for this visit:  Essential hypertension, benign- his blood pressure is well-controlled, electrolytes and renal function are stable. -     CBC with Differential/Platelet; Future -     Comprehensive metabolic panel; Future  Hyperglycemia- he is prediabetic, no medications are needed, he will continue to work on his lifestyle modifications. -     Comprehensive metabolic panel; Future -     Hemoglobin A1c; Future  Diarrhea of presumed infectious origin- his white blood cell count is actually low at 5.5 [place] think he has C. difficile or bacterial enterocolitis, I will check a fecal lactoferrin to screen for bacterial illness and of also ordered a GI panel to screen for viral causes. He is improving so my alter that he has had viral gastritis that is improving. -     GI pathogen panel by PCR, stool; Future -     Fecal lactoferrin; Future  Elevated total protein- he has an elevated total protein level and a monocytosis, this may be related to the acute illness but I think  the findings are more significant than that so asked him to see hematology to be evaluated for possible lymphoproliferative disease. -     Ambulatory referral to Hematology  I am having Mr. Gottschall maintain his aspirin, fluticasone, and torsemide.  No orders of the defined types were placed in this encounter.     Follow-up: Return if symptoms worsen or fail to improve.  Sanda Linger, MD

## 2015-09-10 ENCOUNTER — Encounter: Payer: Self-pay | Admitting: Internal Medicine

## 2015-09-14 ENCOUNTER — Other Ambulatory Visit: Payer: Self-pay

## 2015-09-16 ENCOUNTER — Encounter: Payer: Self-pay | Admitting: Hematology

## 2015-09-17 ENCOUNTER — Encounter: Payer: Self-pay | Admitting: Hematology

## 2015-09-17 ENCOUNTER — Telehealth: Payer: Self-pay | Admitting: Hematology

## 2015-09-17 NOTE — Telephone Encounter (Signed)
Verified address and insurance, in basket referring office of appt date/time, mailed new pt packet

## 2015-09-30 ENCOUNTER — Telehealth: Payer: Self-pay | Admitting: Hematology

## 2015-09-30 ENCOUNTER — Ambulatory Visit (HOSPITAL_BASED_OUTPATIENT_CLINIC_OR_DEPARTMENT_OTHER): Payer: Medicaid Other

## 2015-09-30 ENCOUNTER — Ambulatory Visit (HOSPITAL_BASED_OUTPATIENT_CLINIC_OR_DEPARTMENT_OTHER): Payer: Medicaid Other | Admitting: Hematology

## 2015-09-30 ENCOUNTER — Encounter: Payer: Self-pay | Admitting: Hematology

## 2015-09-30 VITALS — BP 140/82 | HR 87 | Temp 98.2°F | Resp 18 | Ht 73.0 in | Wt >= 6400 oz

## 2015-09-30 DIAGNOSIS — E8809 Other disorders of plasma-protein metabolism, not elsewhere classified: Secondary | ICD-10-CM | POA: Insufficient documentation

## 2015-09-30 DIAGNOSIS — D72821 Monocytosis (symptomatic): Secondary | ICD-10-CM

## 2015-09-30 DIAGNOSIS — R779 Abnormality of plasma protein, unspecified: Secondary | ICD-10-CM | POA: Insufficient documentation

## 2015-09-30 LAB — CBC & DIFF AND RETIC
BASO%: 0.3 % (ref 0.0–2.0)
BASOS ABS: 0 10*3/uL (ref 0.0–0.1)
EOS ABS: 0.2 10*3/uL (ref 0.0–0.5)
EOS%: 3.3 % (ref 0.0–7.0)
HEMATOCRIT: 43 % (ref 38.4–49.9)
HEMOGLOBIN: 14.4 g/dL (ref 13.0–17.1)
Immature Retic Fract: 7.5 % (ref 3.00–10.60)
LYMPH%: 22 % (ref 14.0–49.0)
MCH: 32.3 pg (ref 27.2–33.4)
MCHC: 33.5 g/dL (ref 32.0–36.0)
MCV: 96.4 fL (ref 79.3–98.0)
MONO#: 0.7 10*3/uL (ref 0.1–0.9)
MONO%: 10.8 % (ref 0.0–14.0)
NEUT#: 4.1 10*3/uL (ref 1.5–6.5)
NEUT%: 63.6 % (ref 39.0–75.0)
Platelets: 229 10*3/uL (ref 140–400)
RBC: 4.46 10*6/uL (ref 4.20–5.82)
RDW: 14.2 % (ref 11.0–14.6)
Retic %: 1.91 % — ABNORMAL HIGH (ref 0.80–1.80)
Retic Ct Abs: 85.19 10*3/uL (ref 34.80–93.90)
WBC: 6.5 10*3/uL (ref 4.0–10.3)
lymph#: 1.4 10*3/uL (ref 0.9–3.3)

## 2015-09-30 LAB — LACTATE DEHYDROGENASE: LDH: 150 U/L (ref 125–245)

## 2015-09-30 LAB — COMPREHENSIVE METABOLIC PANEL
ALBUMIN: 3.3 g/dL — AB (ref 3.5–5.0)
ALK PHOS: 90 U/L (ref 40–150)
ALT: 19 U/L (ref 0–55)
AST: 21 U/L (ref 5–34)
Anion Gap: 8 mEq/L (ref 3–11)
BILIRUBIN TOTAL: 0.42 mg/dL (ref 0.20–1.20)
BUN: 8.5 mg/dL (ref 7.0–26.0)
CO2: 31 mEq/L — ABNORMAL HIGH (ref 22–29)
Calcium: 9.4 mg/dL (ref 8.4–10.4)
Chloride: 100 mEq/L (ref 98–109)
Creatinine: 0.8 mg/dL (ref 0.7–1.3)
Glucose: 107 mg/dl (ref 70–140)
POTASSIUM: 3.8 meq/L (ref 3.5–5.1)
Sodium: 139 mEq/L (ref 136–145)
TOTAL PROTEIN: 9.2 g/dL — AB (ref 6.4–8.3)

## 2015-09-30 LAB — CHCC SMEAR

## 2015-09-30 NOTE — Telephone Encounter (Signed)
per pof to sch pt appt-gave pt copy of avs-sent back to [place] 

## 2015-09-30 NOTE — Progress Notes (Signed)
Marland Kitchen    HEMATOLOGY/ONCOLOGY CONSULTATION NOTE  Date of Service: 09/30/2015  Patient Care Team: Janith Lima, MD as PCP - General (Internal Medicine)  CHIEF COMPLAINTS/PURPOSE OF CONSULTATION:   Elevated total protein and monocytosis.  HISTORY OF PRESENTING ILLNESS:   Tony Mccullough is a wonderful 35 y.o. male who has been referred to [place] by Dr .Scarlette Calico, MD for evaluation and management of her elevated total protein and monocytosis.  Patient has a history of hypertension, severe sleep apnea on BiPAP with bleed in oxygen, bilateral chronic lymphedema and venous stasis with intermittent venous ulceration in lower extremities who recently in early May was noted to have severe diarrhea with mucous that lasted for a few days. He was worked up for C. difficile and other infectious issues by his primary care physician. He notes that his diarrhea has now resolved.  He reports that his venous ulcerations are starting to get worse again and that he will be seen by wound care team whom he is seen before. He does have a lymphedema pumps at home.  He had routine follow-up labs on 09/09/2015 with his primary care physician after resolution of his recent diarrhea . He was noted to have an elevated protein level of 9.3 with an albumin of 3.6. He was also noted to have mild new monocytosis of 1300 and was referred to [place] for further evaluation.  Repeat CBC today shows resolution of the monocytosis side count of 700 suggesting that this was likely reactive in the setting of his recent acute diarrhea which could have been viral. His repeat CMP today still shows an elevated total protein of 9.2 with an albumin of 3.3 suggesting increased gammaglobulin fraction. This could certainly be from his chronic inflammatory state due to his lower extremity venous ulcers with lymphedema and from his recent significant diarrhea. He reports no new bone pains. Labs show no anemia or acute renal failure or hypercalcemia to  suggest significant paraprotein. Also at his age multiple myeloma would be unlikely. No fevers no chills no unexpected weight loss no night sweats.  MEDICAL HISTORY:  Past Medical History  Diagnosis Date  . Hypertension   . Respiratory failure ([place]) 09/02/2008    s/p pneumonia  . Wears glasses   . Venous stasis dermatitis   . Morbid obesity ([place])   . OSA (obstructive sleep apnea)   . CHF (congestive heart failure) ([place]) 09/02/2008  . Elephantiasis   . Lymphedema of lower extremity     chronic, legs  . Enlarged heart     SURGICAL HISTORY: Past Surgical History  Procedure Laterality Date  . Tracheostomy      SOCIAL HISTORY: Social History   Social History  . Marital Status: Single    Spouse Name: n/a  . Number of Children: 0  . Years of Education: 12+   Occupational History  . forklift driver Economist   Social History Main Topics  . Smoking status: Never Smoker   . Smokeless tobacco: Never Used  . Alcohol Use: Yes     Comment: "once in a blue moon."  . Drug Use: No  . Sexual Activity: Not on file   Other Topics Concern  . Not on file   Social History Narrative   Lives with his mother.    FAMILY HISTORY: Family History  Problem Relation Age of Onset  . Hypertension    . Heart failure    . Diabetes Mother   . Heart disease Father   .  Hyperlipidemia Father   . Cancer Neg Hx   . Alcohol abuse Neg Hx   . COPD Neg Hx   . Depression Neg Hx   . Drug abuse Neg Hx   . Early death Neg Hx   . Hearing loss Neg Hx   . Learning disabilities Neg Hx   . Stroke Neg Hx     ALLERGIES:  has No Known Allergies.  MEDICATIONS:  Current Outpatient Prescriptions  Medication Sig Dispense Refill  . aspirin EC 325 MG EC tablet Take 1 tablet (325 mg total) by mouth daily. (Patient not taking: Reported on 09/09/2015) 30 tablet 0  . fluticasone (FLONASE) 50 MCG/ACT nasal spray Place 2 sprays into both nostrils daily. (Patient not taking: Reported on 09/09/2015) 16 g 2    . torsemide (DEMADEX) 20 MG tablet Take 1 tablet (20 mg total) by mouth daily. 30 tablet 5   No current facility-administered medications for this visit.    REVIEW OF SYSTEMS:    10 Point review of Systems was done is negative except as noted above.  PHYSICAL EXAMINATION: ECOG PERFORMANCE STATUS: 2 - Symptomatic, <50% confined to bed  . Filed Vitals:   09/30/15 1101  BP: 140/82  Pulse: 87  Temp: 98.2 F (36.8 C)  Resp: 18   Filed Weights   09/30/15 1101  Weight: 435 lb 14.4 oz (197.723 kg)   .Body mass index is 57.52 kg/(m^2).  GENERAL:alert, in no acute distress and comfortable SKIN: skin color, texture, turgor are normal, no rashes or significant lesions EYES: normal, conjunctiva are pink and non-injected, sclera clear OROPHARYNX:no exudate, no erythema and lips, buccal mucosa, and tongue normal  NECK: supple, no JVD, thyroid normal size, non-tender, without nodularity LYMPH:  no palpable lymphadenopathy in the cervical, axillary or inguinal LUNGS: clear to auscultation with normal respiratory effort HEART: regular rate & rhythm,  no murmurs and no lower extremity edema ABDOMEN: abdomen soft, non-tender, normoactive bowel sounds  Musculoskeletal: no cyanosis of digits and no clubbing  PSYCH: alert & oriented x 3 with fluent speech NEURO: no focal motor/sensory deficits  LABORATORY DATA:  I have reviewed the data as listed.  . CBC Latest Ref Rng 09/30/2015 09/09/2015 08/21/2014  WBC 4.0 - 10.3 10e3/uL 6.5 5.5 5.7  Hemoglobin 13.0 - 17.1 g/dL 14.4 15.1 16.1  Hematocrit 38.4 - 49.9 % 43.0 43.8 52.1(H)  Platelets 140 - 400 10e3/uL 229 177.0 150    . CMP Latest Ref Rng 09/30/2015 09/09/2015 08/21/2014  Glucose 70 - 140 mg/dl 107 104(H) 108(H)  BUN 7.0 - 26.0 mg/dL 8.'5 12 9  '$ Creatinine 0.7 - 1.3 mg/dL 0.8 0.87 0.72  Sodium 136 - 145 mEq/L 139 136 137  Potassium 3.5 - 5.1 mEq/L 3.8 3.6 3.9  Chloride 96 - 112 mEq/L - 94(L) 88(L)  CO2 22 - 29 mEq/L 31(H) 36(H) 43(HH)   Calcium 8.4 - 10.4 mg/dL 9.4 9.2 8.4  Total Protein 6.4 - 8.3 g/dL 9.2(H) 9.3(H) 7.8  Total Bilirubin 0.20 - 1.20 mg/dL 0.42 0.5 0.5  Alkaline Phos 40 - 150 U/L 90 77 70  AST 5 - 34 U/L 21 43(H) 39(H)  ALT 0 - 55 U/L 19 34 39   . Lab Results  Component Value Date   LDH 150 09/30/2015    RADIOGRAPHIC STUDIES: I have personally reviewed the radiological images as listed and agreed with the findings in the report. No results found.  ASSESSMENT & PLAN:   35 year old African-American male with morbid obesity, chronic lymphedema with  recurrent venous ulcers with  #1 increased total protein level was total protein of 9.2 with an albumin of 3.3. This is up from her total protein level of 7.8 about a year ago. This could certainly be a polyclonal gammopathy from his chronic inflammatory state due to his morbid obesity, chronic venous insufficiency with chronic leg wounds from venous ulcers with additional acute diarrhea in the latter part of April 2017. His age makes it unlikely to be a monoclonal gammopathy/clonal paraproteinemia. Also he has no anemia unknown hypercalcemia no bone pains. LDH is within normal limits with suggest against a lymphoproliferative disorder Plan  -We will get a SPEP with quantitative immunoglobulins and serum free light chains to rule out a monoclonal hypergammaglobulinemia .  #2 monocytosis - likely reactive due to his recent diarrhea which could be a viral process.  Repeat CBC today shows resolution of monocytosis.  We'll follow up on his remaining lab results. We'll not set up a follow-up unless suggested need by his lab results. Continue follow-up with primary care physician.  plz Reconsult [place] if needed if new questions or concerns arise.   Patient's questions were answered to his apparent satisfaction. The patient knows to call the clinic with any problems, questions or concerns.  I spent 45 minutes counseling the patient face to face. The total time  spent in the appointment was 45 minutes and more than 50% was on counseling and direct patient cares.    Sullivan Lone MD [place] [facility] Hematology/Oncology Physician [facility]  (Office):       [phone] (Work cell):  [phone] (Fax):           [phone]  09/30/2015 11:03 AM

## 2015-10-01 LAB — KAPPA/LAMBDA LIGHT CHAINS
Ig Kappa Free Light Chain: 67.25 mg/L — ABNORMAL HIGH (ref 3.30–19.40)
Ig Lambda Free Light Chain: 43.67 mg/L — ABNORMAL HIGH (ref 5.71–26.30)
KAPPA/LAMBDA FLC RATIO: 1.54 (ref 0.26–1.65)

## 2015-10-01 LAB — SEDIMENTATION RATE: Sedimentation Rate-Westergren: 36 mm/hr — ABNORMAL HIGH (ref 0–15)

## 2015-10-02 LAB — MULTIPLE MYELOMA PANEL, SERUM
ALPHA 1: 0.2 g/dL (ref 0.0–0.4)
ALPHA2 GLOB SERPL ELPH-MCNC: 0.6 g/dL (ref 0.4–1.0)
Albumin SerPl Elph-Mcnc: 3.3 g/dL (ref 2.9–4.4)
Albumin/Glob SerPl: 0.7 (ref 0.7–1.7)
B-Globulin SerPl Elph-Mcnc: 1.6 g/dL — ABNORMAL HIGH (ref 0.7–1.3)
Gamma Glob SerPl Elph-Mcnc: 2.8 g/dL — ABNORMAL HIGH (ref 0.4–1.8)
Globulin, Total: 5.1 g/dL — ABNORMAL HIGH (ref 2.2–3.9)
IGA/IMMUNOGLOBULIN A, SERUM: 864 mg/dL — AB (ref 90–386)
IGM (IMMUNOGLOBIN M), SRM: 126 mg/dL (ref 20–172)
Total Protein: 8.4 g/dL (ref 6.0–8.5)

## 2015-11-30 ENCOUNTER — Other Ambulatory Visit: Payer: Self-pay | Admitting: *Deleted

## 2015-11-30 DIAGNOSIS — L97919 Non-pressure chronic ulcer of unspecified part of right lower leg with unspecified severity: Secondary | ICD-10-CM

## 2015-11-30 DIAGNOSIS — I83019 Varicose veins of right lower extremity with ulcer of unspecified site: Secondary | ICD-10-CM

## 2015-11-30 DIAGNOSIS — I10 Essential (primary) hypertension: Secondary | ICD-10-CM

## 2015-11-30 DIAGNOSIS — L97929 Non-pressure chronic ulcer of unspecified part of left lower leg with unspecified severity: Secondary | ICD-10-CM

## 2015-11-30 DIAGNOSIS — I83029 Varicose veins of left lower extremity with ulcer of unspecified site: Secondary | ICD-10-CM

## 2015-11-30 MED ORDER — TORSEMIDE 20 MG PO TABS: 20.0000 mg | ORAL_TABLET | Freq: Every day | ORAL | 0 refills | Status: DC

## 2015-11-30 NOTE — Telephone Encounter (Signed)
Rec'd call pt is requesting refill on his Torsemide. Verified pharmacy inform will send to [facility]...Raechel Chute

## 2016-03-14 ENCOUNTER — Encounter: Payer: Self-pay | Admitting: Pulmonary Disease

## 2016-03-14 ENCOUNTER — Ambulatory Visit (INDEPENDENT_AMBULATORY_CARE_PROVIDER_SITE_OTHER): Payer: Medicaid Other | Admitting: Pulmonary Disease

## 2016-03-14 VITALS — BP 124/80 | HR 94 | Ht 73.0 in | Wt >= 6400 oz

## 2016-03-14 DIAGNOSIS — Z6841 Body Mass Index (BMI) 40.0 and over, adult: Secondary | ICD-10-CM

## 2016-03-14 DIAGNOSIS — G4733 Obstructive sleep apnea (adult) (pediatric): Secondary | ICD-10-CM

## 2016-03-14 DIAGNOSIS — J9611 Chronic respiratory failure with hypoxia: Secondary | ICD-10-CM

## 2016-03-14 DIAGNOSIS — E662 Morbid (severe) obesity with alveolar hypoventilation: Secondary | ICD-10-CM

## 2016-03-14 NOTE — Patient Instructions (Signed)
Follow up in 1 year.

## 2016-03-14 NOTE — Progress Notes (Signed)
Current Outpatient Prescriptions on File Prior to Visit  Medication Sig  . aspirin EC 325 MG EC tablet Take 1 tablet (325 mg total) by mouth daily.  . fluticasone (FLONASE) 50 MCG/ACT nasal spray Place 2 sprays into both nostrils daily.  Marland Kitchen. torsemide (DEMADEX) 20 MG tablet Take 1 tablet (20 mg total) by mouth daily. Must see MD for future refills   No current facility-administered medications on file prior to visit.      Chief Complaint  Patient presents with  . Recertification for O2    Pt. has new insurance comapny, and needs to be requalified for 24 hour O2, Pt. states his breathing remains unchanged, only feels different when he being more active     Sleep tests 2011 >> Tracheostomy decannulation Split 03/02/14 >> AHI 107.5, SaO2 low 60%. BiPAP 21/18 cm H2O with 3 liters oxygen >> AHI 0 Auto BiPAP 05/25/15 to 06/23/15 >> used on 29 of 30 nights with average 5 hrs 47 min.  Average AHI 3.2 with median BiPAP 11/7 cm H2O and 95 th percentile BiPAP 14/10 cm H2O  Cardiac tests Echo 12/03/13 >> mild LVH, EF 60 to 65%, mild RA dilation  Pulmonary tests CT chest 08/19/14 >> no PE  Past medical history HTN, Diastolic heart failure, lymph edema lower extremities  Past surgical history, Family history, Social history, Allergies reviewed  Vital Signs BP 124/80 (BP Location: Left Arm, Patient Position: Sitting, Cuff Size: Large)   Pulse 94   Ht 6\' 1"  (1.854 m)   Wt (!) 418 lb 12.8 oz (190 kg)   SpO2 97%   BMI 55.25 kg/m   History of Present Illness Tony Mccullough is a 35 y.o. male with with OSA and OHS.  He needs to re-qualify for home oxygen set up.  He has been using 2 liters during the day and 3 liters at night.  He uses Bipap nightly. Working better since he got new mask.  [place]  This was fun, but he got tired easily.  Physical Exam  General - No distress, wearing oxygen ENT - No sinus tenderness, no oral exudate, no LAN, MP 3 Cardiac - s1s2  regular, no murmur Chest - No wheeze/rales/dullness Back - No focal tenderness Abd - Soft, non-tender Ext - 3+ edema, legs in wrap, foul smell Neuro - Normal strength Skin - No rashes Psych - normal mood, and behavior   Wt Readings from Last 3 Encounters:  03/14/16 (!) 418 lb 12.8 oz (190 kg)  09/30/15 (!) 435 lb 14.4 oz (197.7 kg)  09/09/15 (!) 420 lb (190.5 kg)    Assessment/Plan  Obstructive sleep apnea. - He is compliant with BiPAP and reports benefit. - continue auto BiPAP with range 5 to 25 cm H2O  Chronic hypoxic respiratory failure secondary to obesity hypoventilation syndrome. - continue 2 liters oxygen during the day, and 3 liters oxygen at night with BiPAP  Morbid obesity. - continue weight loss efforts  Venous stasis ulcers on legs. - he is in the process of arranging for new PCP   Patient Instructions  Follow up in 1 year    Coralyn HellingVineet Randle Shatzer, MD [place] [facility]/Critical [facility]/Sleep Pager:  [phone] 03/14/2016, 12:22 PM

## 2016-03-16 ENCOUNTER — Telehealth: Payer: Self-pay | Admitting: Pulmonary Disease

## 2016-03-16 DIAGNOSIS — G4733 Obstructive sleep apnea (adult) (pediatric): Secondary | ICD-10-CM

## 2016-03-16 NOTE — Telephone Encounter (Signed)
Spoke with [place] at [facility]. When pt was here on 03/14/16, he was to requalified for his oxygen. The walk was done incorrectly and [name] can't requalify the pt based on those results. I called and spoke with the pt and apologized for any inconvenience. Pt has been scheduled to come in on 03/17/16 at 10am to redo his walk. Nothing further was needed.

## 2016-03-17 ENCOUNTER — Ambulatory Visit (INDEPENDENT_AMBULATORY_CARE_PROVIDER_SITE_OTHER): Payer: Medicaid Other | Admitting: Pulmonary Disease

## 2016-03-17 DIAGNOSIS — E662 Morbid (severe) obesity with alveolar hypoventilation: Secondary | ICD-10-CM | POA: Diagnosis not present

## 2016-03-17 DIAGNOSIS — G4733 Obstructive sleep apnea (adult) (pediatric): Secondary | ICD-10-CM | POA: Diagnosis not present

## 2016-03-17 DIAGNOSIS — J9611 Chronic respiratory failure with hypoxia: Secondary | ICD-10-CM

## 2016-03-17 NOTE — Addendum Note (Signed)
Addended by: Boone MasterJONES, JESSICA E on: 03/17/2016 11:48 AM   Modules accepted: Orders

## 2016-03-17 NOTE — Telephone Encounter (Signed)
Qualifying walk done and documented in care coordination note [name] unable to view chart in epic Will send order

## 2016-03-17 NOTE — Progress Notes (Signed)
Ambulating walk done to requalify pt for continuous O2 No charge Order placed to [facility]

## 2016-06-21 ENCOUNTER — Ambulatory Visit (INDEPENDENT_AMBULATORY_CARE_PROVIDER_SITE_OTHER): Payer: Medicaid Other | Admitting: Internal Medicine

## 2016-06-21 ENCOUNTER — Encounter (INDEPENDENT_AMBULATORY_CARE_PROVIDER_SITE_OTHER): Payer: Self-pay

## 2016-06-21 DIAGNOSIS — L97929 Non-pressure chronic ulcer of unspecified part of left lower leg with unspecified severity: Secondary | ICD-10-CM

## 2016-06-21 DIAGNOSIS — I872 Venous insufficiency (chronic) (peripheral): Secondary | ICD-10-CM

## 2016-06-21 DIAGNOSIS — I890 Lymphedema, not elsewhere classified: Secondary | ICD-10-CM | POA: Diagnosis not present

## 2016-06-21 DIAGNOSIS — I10 Essential (primary) hypertension: Secondary | ICD-10-CM | POA: Diagnosis not present

## 2016-06-21 DIAGNOSIS — D72821 Monocytosis (symptomatic): Secondary | ICD-10-CM

## 2016-06-21 DIAGNOSIS — J9611 Chronic respiratory failure with hypoxia: Secondary | ICD-10-CM | POA: Diagnosis not present

## 2016-06-21 DIAGNOSIS — Z9981 Dependence on supplemental oxygen: Secondary | ICD-10-CM

## 2016-06-21 DIAGNOSIS — F329 Major depressive disorder, single episode, unspecified: Secondary | ICD-10-CM | POA: Diagnosis not present

## 2016-06-21 DIAGNOSIS — I83029 Varicose veins of left lower extremity with ulcer of unspecified site: Secondary | ICD-10-CM

## 2016-06-21 DIAGNOSIS — R7303 Prediabetes: Secondary | ICD-10-CM

## 2016-06-21 DIAGNOSIS — Z0289 Encounter for other administrative examinations: Secondary | ICD-10-CM

## 2016-06-21 DIAGNOSIS — Z6841 Body Mass Index (BMI) 40.0 and over, adult: Secondary | ICD-10-CM

## 2016-06-21 DIAGNOSIS — L97919 Non-pressure chronic ulcer of unspecified part of right lower leg with unspecified severity: Secondary | ICD-10-CM

## 2016-06-21 DIAGNOSIS — E662 Morbid (severe) obesity with alveolar hypoventilation: Secondary | ICD-10-CM

## 2016-06-21 DIAGNOSIS — Z9989 Dependence on other enabling machines and devices: Secondary | ICD-10-CM

## 2016-06-21 DIAGNOSIS — I83019 Varicose veins of right lower extremity with ulcer of unspecified site: Secondary | ICD-10-CM

## 2016-06-21 DIAGNOSIS — Z0000 Encounter for general adult medical examination without abnormal findings: Secondary | ICD-10-CM

## 2016-06-21 LAB — GLUCOSE, CAPILLARY: GLUCOSE-CAPILLARY: 103 mg/dL — AB (ref 65–99)

## 2016-06-21 LAB — POCT GLYCOSYLATED HEMOGLOBIN (HGB A1C): HEMOGLOBIN A1C: 5.6

## 2016-06-21 MED ORDER — TORSEMIDE 20 MG PO TABS: 20.0000 mg | ORAL_TABLET | Freq: Every day | ORAL | 0 refills | Status: DC

## 2016-06-21 NOTE — Progress Notes (Signed)
   CC: establish with PCP, OSA  HPI:  Tony Mccullough is a 36 y.o. M with medical history as outlined below who presents to the clinic to establish care with new PCP. His previous PCP does not accept his insurance any further.   Super Morbid Obesity: Current weight 409, HW 460's. Patient has been modifying his diet however unable to exercise significantly due to body habitus. Has considered gastric bypass however not currently interested and is afraid of procedure.   Depression: Reports a several year history of depression described as feeling down and crying intermittently. Denies any thoughts of wanting to harm himself or others. Reports he draws and writes stories to help and get his mind off of it. Has published one book on [place] and is working on another. Has never been on antidepressants before.   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 Chronically on 2.5 L O2 at all times and wears CPAP every night. He is on disability due this and his obesity. Has a pulm physician who he last saw 04/2016.   BL LE Lymphedema, venous stasis: Takes Torsemide 20 mg daily. Sees wound care for chronic venous stasis ulcer.   Pre-Diabetic: Endorses history of this. Not on any medications. Working on losing weight and improving diet.   Past Medical History:  Diagnosis Date  . CHF (congestive heart failure) ([facility]) 09/02/2008  . Elephantiasis   . Enlarged heart   . Hypertension   . Lymphedema of lower extremity    chronic, legs  . Morbid obesity ([facility])   . OSA (obstructive sleep apnea)   . Respiratory failure ([facility]) 09/02/2008   s/p pneumonia  . Venous stasis dermatitis   . Wears glasses     Review of Systems:  Review of Systems  Constitutional: Positive for weight loss (intentional). Negative for chills, fever and malaise/fatigue.  HENT: Negative for hearing loss and sore throat.   Eyes: Negative for blurred vision and double  vision.  Respiratory: Positive for shortness of breath (with activity). Negative for cough and wheezing.   Cardiovascular: Positive for leg swelling (chronic lymphedema). Negative for chest pain.  Gastrointestinal: Negative for abdominal pain, blood in stool, diarrhea, nausea and vomiting.  Genitourinary: Negative for frequency.  Neurological: Negative for dizziness, sensory change and headaches.  Endo/Heme/Allergies: Negative for polydipsia.  Psychiatric/Behavioral: Positive for depression. Negative for substance abuse and suicidal ideas. The patient is nervous/anxious. The patient does not have insomnia.    Physical Exam: Physical Exam  Constitutional: He appears well-developed. No distress.  Over nourished  HENT:  Head: Normocephalic and atraumatic.  Eyes: Pupils are equal, round, and reactive to light. No scleral icterus.  Cardiovascular: Normal rate, regular rhythm and normal heart sounds.   Pulmonary/Chest: Effort normal and breath sounds normal. No respiratory distress.  Abdominal: Soft. Bowel sounds are normal. He exhibits no distension. There is no tenderness.  Morbidly obese abdomen  Musculoskeletal: He exhibits edema. He exhibits no tenderness.  Skin: Skin is warm and dry. Capillary refill takes less than 2 seconds. He is not diaphoretic.   Vitals:   06/21/16 0927  BP: 122/68  Pulse: 92  Temp: 98 F (36.7 C)  TempSrc: Oral  SpO2: 96%  Weight: (!) 409 lb 3.2 oz (185.6 kg)  Height: 6\' 1"  (1.854 m)   Assessment & Plan:   See Encounters Tab for problem based charting.  Patient discussed with Dr. Cleda DaubE. Hoffman

## 2016-06-21 NOTE — Patient Instructions (Addendum)
It was a pleasure meeting you today! Thank you for choosing the [facility] for your healthcare. I look forward to working with you over the next several years.   1. Today we talked about a variety of different health problems today. You are doing a great job losing weight! Please keep it up! We have a nutritionist on staff in our office who can help you continue to make better choices. I have referred you for an initial appointment with her.  2. Today we also talked about your lung problems. Please continue following with your lung doctor.  3. Today I am obtaining several labs. I am checking your HbA1c which is a measurement of your blood sugar over the past 3 months. I am also checking a lipid panel (checks your cholesterol), complete metabolic panel (checks your liver and kidney function) and also a complete blood count (to check your white blood cells). I will call you if we need to start medications based on these results.  4. Please follow-up here in 3 months, or sooner if needed 5. If you change your mind about antidepressants, please let me know!

## 2016-06-22 LAB — CMP14 + ANION GAP
A/G RATIO: 0.8 — AB (ref 1.2–2.2)
ALK PHOS: 89 IU/L (ref 39–117)
ALT: 19 IU/L (ref 0–44)
AST: 18 IU/L (ref 0–40)
Albumin: 3.9 g/dL (ref 3.5–5.5)
Anion Gap: 16 mmol/L (ref 10.0–18.0)
BUN/Creatinine Ratio: 18 (ref 9–20)
BUN: 16 mg/dL (ref 6–20)
Bilirubin Total: 0.5 mg/dL (ref 0.0–1.2)
CO2: 30 mmol/L — ABNORMAL HIGH (ref 18–29)
Calcium: 9.2 mg/dL (ref 8.7–10.2)
Chloride: 94 mmol/L — ABNORMAL LOW (ref 96–106)
Creatinine, Ser: 0.87 mg/dL (ref 0.76–1.27)
GFR calc Af Amer: 129 mL/min/{1.73_m2} (ref >59)
GFR calc non Af Amer: 112 mL/min/{1.73_m2} (ref >59)
GLOBULIN, TOTAL: 4.8 g/dL — AB (ref 1.5–4.5)
Glucose: 99 mg/dL (ref 65–99)
Potassium: 3.8 mmol/L (ref 3.5–5.2)
SODIUM: 140 mmol/L (ref 134–144)
Total Protein: 8.7 g/dL — ABNORMAL HIGH (ref 6.0–8.5)

## 2016-06-22 LAB — CBC WITH DIFFERENTIAL/PLATELET
BASOS: 0 %
Basophils Absolute: 0 10*3/uL (ref 0.0–0.2)
EOS (ABSOLUTE): 0.2 10*3/uL (ref 0.0–0.4)
Eos: 2 %
Hematocrit: 43.8 % (ref 37.5–51.0)
Hemoglobin: 14.7 g/dL (ref 13.0–17.7)
IMMATURE GRANS (ABS): 0 10*3/uL (ref 0.0–0.1)
IMMATURE GRANULOCYTES: 0 %
LYMPHS: 21 %
Lymphocytes Absolute: 1.6 10*3/uL (ref 0.7–3.1)
MCH: 31.6 pg (ref 26.6–33.0)
MCHC: 33.6 g/dL (ref 31.5–35.7)
MCV: 94 fL (ref 79–97)
Monocytes Absolute: 0.9 10*3/uL (ref 0.1–0.9)
Monocytes: 12 %
NEUTROS ABS: 4.8 10*3/uL (ref 1.4–7.0)
Neutrophils: 65 %
Platelets: 233 10*3/uL (ref 150–379)
RBC: 4.65 x10E6/uL (ref 4.14–5.80)
RDW: 15.4 % (ref 12.3–15.4)
WBC: 7.5 10*3/uL (ref 3.4–10.8)

## 2016-06-22 LAB — HIV ANTIBODY (ROUTINE TESTING W REFLEX): HIV Screen 4th Generation wRfx: NONREACTIVE

## 2016-06-22 LAB — LIPID PANEL
CHOLESTEROL TOTAL: 118 mg/dL (ref 100–199)
Chol/HDL Ratio: 3.4 ratio units (ref 0.0–5.0)
HDL: 35 mg/dL — ABNORMAL LOW (ref >39)
LDL Calculated: 65 mg/dL (ref 0–99)
TRIGLYCERIDES: 91 mg/dL (ref 0–149)
VLDL Cholesterol Cal: 18 mg/dL (ref 5–40)

## 2016-06-24 NOTE — Assessment & Plan Note (Signed)
Noted 09/2015 following a diarrheal illness. IG elevated at that time as well. Seen by oncology who felt these changes were consistent with his acute diarrheal illness.  -CBC w/ diff today

## 2016-06-24 NOTE — Assessment & Plan Note (Signed)
Lipid panel: Normal, except for low HDL HIV: Negative Flu vaccine given today

## 2016-06-24 NOTE — Assessment & Plan Note (Signed)
Normotensive at this time without antihypertensives. Patient controlling currently with diet. Will continue to monitor.

## 2016-06-24 NOTE — Progress Notes (Signed)
Internal Medicine Clinic Attending  Case discussed with Dr. Vincente LibertyMolt at the time of the visit.  We reviewed the resident's history and exam and pertinent patient test results.  I agree with the assessment, diagnosis, and plan of care documented in the resident's note. It appears most of his chronic medical problems are weight related.  We will refer him to our nutritionist today however I I think we need to consider if he may benefit more from bariatric surgery in the near future.

## 2016-06-24 NOTE — Assessment & Plan Note (Addendum)
HbA1c obtained today normal at 5.6%. Continuing to encourage weight loss.  -Referred to medical nutrition therapy

## 2016-06-24 NOTE — Assessment & Plan Note (Addendum)
Follows with pulmonology. Saturating well on home 2.5 L O2. No wheezing appreciated on examination however difficult to appreciate sounds due to patients body habitus.

## 2016-06-24 NOTE — Assessment & Plan Note (Signed)
Follows up with the wound care center regularly

## 2016-09-12 ENCOUNTER — Telehealth: Payer: Self-pay | Admitting: Internal Medicine

## 2016-09-12 NOTE — Telephone Encounter (Signed)
LMOM for appt 09/13/2016 with DP and Dr. Vincente LibertyMolt 2pm and 3:15pm

## 2016-09-13 ENCOUNTER — Encounter: Payer: Medicaid Other | Admitting: Internal Medicine

## 2016-09-13 ENCOUNTER — Encounter: Payer: Medicaid Other | Admitting: Dietician

## 2016-09-23 NOTE — Addendum Note (Signed)
Addended by: Neomia DearPOWERS, Merced Hanners E on: 09/23/2016 07:47 AM   Modules accepted: Orders

## 2016-10-31 ENCOUNTER — Other Ambulatory Visit: Payer: Self-pay | Admitting: Internal Medicine

## 2016-10-31 DIAGNOSIS — I83019 Varicose veins of right lower extremity with ulcer of unspecified site: Secondary | ICD-10-CM

## 2016-10-31 DIAGNOSIS — L97919 Non-pressure chronic ulcer of unspecified part of right lower leg with unspecified severity: Secondary | ICD-10-CM

## 2016-10-31 DIAGNOSIS — I10 Essential (primary) hypertension: Secondary | ICD-10-CM

## 2016-10-31 DIAGNOSIS — I83029 Varicose veins of left lower extremity with ulcer of unspecified site: Secondary | ICD-10-CM

## 2016-10-31 DIAGNOSIS — L97929 Non-pressure chronic ulcer of unspecified part of left lower leg with unspecified severity: Secondary | ICD-10-CM

## 2016-10-31 MED ORDER — TORSEMIDE 20 MG PO TABS: 20.0000 mg | ORAL_TABLET | Freq: Every day | ORAL | 0 refills | Status: DC

## 2016-10-31 NOTE — Telephone Encounter (Signed)
Patient requesting refill.  He is out of town and needs to use [facility] [place] Address [address]  [place] [id number] Phone number is [phone].  Also if any question please call patient at number listed because he will be out of town until September and that is the number he can be reached at.  torsemide (DEMADEX) 20 MG tablet

## 2017-01-31 ENCOUNTER — Encounter (INDEPENDENT_AMBULATORY_CARE_PROVIDER_SITE_OTHER): Payer: Self-pay

## 2017-01-31 ENCOUNTER — Encounter: Payer: Self-pay | Admitting: Internal Medicine

## 2017-01-31 ENCOUNTER — Ambulatory Visit (INDEPENDENT_AMBULATORY_CARE_PROVIDER_SITE_OTHER): Payer: Self-pay | Admitting: Internal Medicine

## 2017-01-31 DIAGNOSIS — L97918 Non-pressure chronic ulcer of unspecified part of right lower leg with other specified severity: Secondary | ICD-10-CM

## 2017-01-31 DIAGNOSIS — Z9989 Dependence on other enabling machines and devices: Secondary | ICD-10-CM

## 2017-01-31 DIAGNOSIS — L97919 Non-pressure chronic ulcer of unspecified part of right lower leg with unspecified severity: Secondary | ICD-10-CM

## 2017-01-31 DIAGNOSIS — I890 Lymphedema, not elsewhere classified: Secondary | ICD-10-CM

## 2017-01-31 DIAGNOSIS — Z79899 Other long term (current) drug therapy: Secondary | ICD-10-CM

## 2017-01-31 DIAGNOSIS — Z6841 Body Mass Index (BMI) 40.0 and over, adult: Secondary | ICD-10-CM

## 2017-01-31 DIAGNOSIS — I83029 Varicose veins of left lower extremity with ulcer of unspecified site: Secondary | ICD-10-CM

## 2017-01-31 DIAGNOSIS — L97929 Non-pressure chronic ulcer of unspecified part of left lower leg with unspecified severity: Secondary | ICD-10-CM

## 2017-01-31 DIAGNOSIS — J9610 Chronic respiratory failure, unspecified whether with hypoxia or hypercapnia: Secondary | ICD-10-CM

## 2017-01-31 DIAGNOSIS — I10 Essential (primary) hypertension: Secondary | ICD-10-CM

## 2017-01-31 DIAGNOSIS — I83009 Varicose veins of unspecified lower extremity with ulcer of unspecified site: Secondary | ICD-10-CM

## 2017-01-31 DIAGNOSIS — E662 Morbid (severe) obesity with alveolar hypoventilation: Secondary | ICD-10-CM

## 2017-01-31 DIAGNOSIS — I83019 Varicose veins of right lower extremity with ulcer of unspecified site: Secondary | ICD-10-CM

## 2017-01-31 DIAGNOSIS — L97928 Non-pressure chronic ulcer of unspecified part of left lower leg with other specified severity: Secondary | ICD-10-CM

## 2017-01-31 MED ORDER — TORSEMIDE 20 MG PO TABS: 20.0000 mg | ORAL_TABLET | Freq: Every day | ORAL | 1 refills | Status: DC

## 2017-01-31 NOTE — Progress Notes (Signed)
   CC: follow-up of morbid obesity, lymphedema, moving to [place].  HPI:  [name] is a very pleasant 36 y.o. M here for medication follow-up. Please see below for details of his chronic medical conditions. He has no acute complaints. He does complain today that he reportedly lost his medicaid and is concerned about his future move to [place]. Plans to move by the end of the year to live with friends/family in the area. He is worried about obtaining medicaid in the area as well as having enough medication to last until he can establish with PCP.   Chronic respiratory failure, OSA: Wears CPAP every night. Has actually stopped wearing his supplemental oxygen (prescribed 2.5L continuously) however does wear it when he is exerting himself.   Super morbid obesity: CW 398, most recent weight 409lbs (06/2016) however max weight ~460 several years ago. Referred to our nutritionist although didn't go. Interested in bariatric surgery however wants to establish with a surgeon in [place].   BL LE lymphedema, venous stasis: On Torsemide 20 mg daily. ECHO 11/2013 technically difficult but no systolic dysfunction. LE wounds resolved.    Past Medical History:  Diagnosis Date  . CHF (congestive heart failure) (HCC) 09/02/2008  . Elephantiasis   . Enlarged heart   . Hypertension   . Lymphedema of lower extremity    chronic, legs  . Morbid obesity (HCC)   . OSA (obstructive sleep apnea)   . Respiratory failure (HCC) 09/02/2008   s/p pneumonia  . Venous stasis dermatitis   . Wears glasses    Review of Systems:   General: +weight loss (intentional). Denies fevers, chills, fatigue HEENT: Denies changes in vision, sore throat, dysphagia Cardiac: +DOE, at baseline. Denies CP, palpitations Pulmonary: +PND when not wearing CPAP. Denies cough, wheezes Abd: Denies diarrhea, constipation, changes in bowels Extremities: Denies weakness or worsening swelling. No ulcerations.  Physical Exam: General: Alert, in no acute  distress. Pleasant and conversant HEENT: No icterus, ptosis. No hoarseness or dysarthria  Cardiac: RRR, no MGR appreciated however difficult to auscultate given habitus Pulmonary: CTA BL although difficult to appreciate fully secondary to body habitus. Normal WOB on RA. Able to speak in complete sentences Abd: Soft, non-tended. +bs Extremities: Warm, perfused. +Lymphedema. No ulcerations.   Vitals:   01/31/17 1328  BP: 134/80  Pulse: 93  Temp: 97.7 F (36.5 C)  TempSrc: Oral  SpO2: 100%  Weight: (!) 398 lb (180.5 kg)  Height:  (1.854 m)   Assessment & Plan:   See Encounters Tab for problem based charting.  Patient discussed with Dr. Oswaldo Done

## 2017-01-31 NOTE — Patient Instructions (Addendum)
It was great seeing you today!   I'm sorry you no longer have Medicaid. I discussed this with our financial counselor here, Chauncey Reading, who recommends you reapply for [facility] Medicaid and transfer it to [place] once you move.   I've sent in a refill for your Torsemide which should last you until after your move. It should be less than $8 at your pharmacy.   Come back and see me as needed, otherwise, good luck in [place]!

## 2017-02-01 NOTE — Assessment & Plan Note (Signed)
Continues to use CPAP at night consistently. He has actually stopped using his supplemental oxygen except for with exertion. He is saturating 100% here on RA.

## 2017-02-01 NOTE — Assessment & Plan Note (Signed)
Resolved after working with wound care consistently for several months! Does still occasionally wear wrappings for compression. Takes torsemide  daily. Most recent renal function 06/2016 wnl. -Refill torsemide 

## 2017-02-01 NOTE — Assessment & Plan Note (Signed)
He continues to work on his diet and increasing physical activity, which he notes is limited due to his body habitus. We discussed bariatric surgery vs medication assistance however he wishes to establish with someone in [place] before heading down this route.  -Continued encouragement of appropriate diet, exercise -Patient to establish with weight specialist in [place]

## 2017-02-01 NOTE — Assessment & Plan Note (Signed)
BP 134/80 with pulse 93. This was just after exertion. He does not have hx of DM2 or CKD.  -Continue diet-controlled treatment -Encouraged continued sodium restriction

## 2017-02-02 NOTE — Addendum Note (Signed)
Addended by: Erlinda Hong T on: 02/02/2017 09:25 AM   Modules accepted: Level of Service

## 2017-02-02 NOTE — Progress Notes (Signed)
Internal Medicine Clinic Attending  Case discussed with Dr. Molt at the time of the visit.  We reviewed the resident's history and exam and pertinent patient test results.  I agree with the assessment, diagnosis, and plan of care documented in the resident's note. 

## 2017-02-08 ENCOUNTER — Encounter: Payer: Self-pay | Admitting: Pulmonary Disease

## 2017-02-08 ENCOUNTER — Ambulatory Visit (INDEPENDENT_AMBULATORY_CARE_PROVIDER_SITE_OTHER): Payer: Medicare Other | Admitting: Pulmonary Disease

## 2017-02-08 VITALS — BP 126/82 | HR 88 | Ht 73.0 in | Wt >= 6400 oz

## 2017-02-08 DIAGNOSIS — Z6841 Body Mass Index (BMI) 40.0 and over, adult: Secondary | ICD-10-CM

## 2017-02-08 DIAGNOSIS — G4733 Obstructive sleep apnea (adult) (pediatric): Secondary | ICD-10-CM

## 2017-02-08 DIAGNOSIS — E662 Morbid (severe) obesity with alveolar hypoventilation: Secondary | ICD-10-CM | POA: Diagnosis not present

## 2017-02-08 DIAGNOSIS — J9611 Chronic respiratory failure with hypoxia: Secondary | ICD-10-CM

## 2017-02-08 NOTE — Patient Instructions (Signed)
Call if you need help getting your records transferred to [place]

## 2017-02-08 NOTE — Progress Notes (Signed)
Current Outpatient Prescriptions on File Prior to Visit  Medication Sig  . aspirin EC 325 MG EC tablet Take 1 tablet (325 mg total) by mouth daily.  Marland Kitchen torsemide (DEMADEX) 20 MG tablet Take 1 tablet (20 mg total) by mouth daily.   No current facility-administered medications on file prior to visit.      Chief Complaint  Patient presents with  . Follow-up    Pt uses 2 liters O2 with  BIPAP each night. Pt moving to [place] end of this year, would like to speak with you regarding the move.     Sleep tests 2011 >> Tracheostomy decannulation Split 03/02/14 >> AHI 107.5, SaO2 low 60%. BiPAP 21/18 cm H2O with 3 liters oxygen >> AHI 0 Auto BiPAP 11/08/16 to 02/05/17 >> used on 90 of 90 nights with average 6 hrs 57 min.  Average AHI 3.1 with median Bipap 12/8 and 95 th percentile Bipap 14/10 cm H2O  Cardiac tests Echo 12/03/13 >> mild LVH, EF 60 to 65%, mild RA dilation  Pulmonary tests CT chest 08/19/14 >> no PE ABG 08/19/14 >> pH 7.32, PCO2 79.6, PO2 61  Past medical history HTN, Diastolic heart failure, lymph edema lower extremities  Past surgical history, Family history, Social history, Allergies reviewed  Vital Signs BP 126/82 (BP Location: Left Arm, Cuff Size: Normal)   Pulse 88   Ht  (1.854 m)   Wt (!) 403 lb 6.4 oz (183 kg)   SpO2 96%   BMI 53.22 kg/m   History of Present Illness Tony Mccullough is a 36 y.o. male with with OSA and OHS.  He is planning to move to [place].  He feels the environment is better there for his mood.  He hopes this will help him stay more active.  He uses Bipap nightly.  No issues with mask fit since he changed to nasal pillows.  Uses 2.5 liters oxygen with exertion and sleep with Bipap.  Legs are much better, and doesn't need to use wraps anymore.  Physical Exam  General - pleasant Eyes - pupils reactive ENT - no sinus tenderness, no oral exudate, no LAN Cardiac - regular, no murmur Chest - no wheeze, rales Abd - soft, non tender Ext - 2+  non pitting edema Skin - no rashes Neuro - normal strength Psych - normal mood   Wt Readings from Last 3 Encounters:  02/08/17 (!) 403 lb 6.4 oz (183 kg)  01/31/17 (!) 398 lb (180.5 kg)  06/21/16 (!) 409 lb 3.2 oz (185.6 kg)    Assessment/Plan  Obstructive sleep apnea. - he is compliant with Bipap and reports benefit - continue auto Bipap range 5 to 25 cm H2O   Chronic hypoxic respiratory failure secondary to obesity hypoventilation syndrome. - continue 2.5 liters oxygen with exertion and sleep with Bipap  Morbid obesity. - encouraged him to continue his weight loss efforts   Patient Instructions  Call if you need help getting your records transferred to [place]    Tony Valenza, MD [facility] Pager:  [phone] 02/08/2017, 12:40 PM

## 2017-02-24 ENCOUNTER — Telehealth: Payer: Self-pay | Admitting: Pulmonary Disease

## 2017-02-24 DIAGNOSIS — J9611 Chronic respiratory failure with hypoxia: Secondary | ICD-10-CM

## 2017-02-24 DIAGNOSIS — E662 Morbid (severe) obesity with alveolar hypoventilation: Secondary | ICD-10-CM

## 2017-02-24 DIAGNOSIS — G4733 Obstructive sleep apnea (adult) (pediatric): Secondary | ICD-10-CM

## 2017-02-24 NOTE — Telephone Encounter (Signed)
Received letter from [facility] informing that they will no longer providers services for Mr. Tony Mccullough.  He is on Auto Bipap and 2.5 liters with exertion and sleep.  [facility] to determine if he can be established with different DME.

## 2017-06-20 ENCOUNTER — Other Ambulatory Visit: Payer: Self-pay

## 2017-06-20 DIAGNOSIS — L97929 Non-pressure chronic ulcer of unspecified part of left lower leg with unspecified severity: Secondary | ICD-10-CM

## 2017-06-20 DIAGNOSIS — I83019 Varicose veins of right lower extremity with ulcer of unspecified site: Secondary | ICD-10-CM

## 2017-06-20 DIAGNOSIS — L97919 Non-pressure chronic ulcer of unspecified part of right lower leg with unspecified severity: Secondary | ICD-10-CM

## 2017-06-20 DIAGNOSIS — I10 Essential (primary) hypertension: Secondary | ICD-10-CM

## 2017-06-20 DIAGNOSIS — I83029 Varicose veins of left lower extremity with ulcer of unspecified site: Secondary | ICD-10-CM

## 2017-06-20 NOTE — Telephone Encounter (Signed)
torsemide (DEMADEX) 20 MG tablet   Refill request @ [facility] ([address] [place], [place] [id number]). Please call pt back.

## 2017-06-21 ENCOUNTER — Telehealth: Payer: Self-pay | Admitting: Internal Medicine

## 2017-06-21 NOTE — Telephone Encounter (Signed)
Patient calling back.   °

## 2017-06-21 NOTE — Telephone Encounter (Signed)
Lm for pt, rtc and informing him denial of refill

## 2017-06-21 NOTE — Telephone Encounter (Signed)
Patient is requesting refills torsemide and [facility] [address] [place] [id number] patient is out of town

## 2017-06-22 ENCOUNTER — Other Ambulatory Visit: Payer: Self-pay | Admitting: Internal Medicine

## 2017-06-22 DIAGNOSIS — L97919 Non-pressure chronic ulcer of unspecified part of right lower leg with unspecified severity: Secondary | ICD-10-CM

## 2017-06-22 DIAGNOSIS — I83019 Varicose veins of right lower extremity with ulcer of unspecified site: Secondary | ICD-10-CM

## 2017-06-22 DIAGNOSIS — I10 Essential (primary) hypertension: Secondary | ICD-10-CM

## 2017-06-22 DIAGNOSIS — I83029 Varicose veins of left lower extremity with ulcer of unspecified site: Secondary | ICD-10-CM

## 2017-06-22 DIAGNOSIS — L97929 Non-pressure chronic ulcer of unspecified part of left lower leg with unspecified severity: Secondary | ICD-10-CM

## 2018-01-18 ENCOUNTER — Other Ambulatory Visit: Payer: Self-pay | Admitting: Internal Medicine

## 2018-01-18 DIAGNOSIS — I83029 Varicose veins of left lower extremity with ulcer of unspecified site: Secondary | ICD-10-CM

## 2018-01-18 DIAGNOSIS — L97919 Non-pressure chronic ulcer of unspecified part of right lower leg with unspecified severity: Secondary | ICD-10-CM

## 2018-01-18 DIAGNOSIS — L97929 Non-pressure chronic ulcer of unspecified part of left lower leg with unspecified severity: Secondary | ICD-10-CM

## 2018-01-18 DIAGNOSIS — I83019 Varicose veins of right lower extremity with ulcer of unspecified site: Secondary | ICD-10-CM

## 2018-01-18 DIAGNOSIS — I10 Essential (primary) hypertension: Secondary | ICD-10-CM

## 2018-03-28 ENCOUNTER — Ambulatory Visit: Payer: Medicare Other

## 2018-03-28 ENCOUNTER — Encounter: Payer: Self-pay | Admitting: Internal Medicine

## 2019-05-18 DIAGNOSIS — M79672 Pain in left foot: Secondary | ICD-10-CM | POA: Diagnosis not present

## 2019-05-18 DIAGNOSIS — Z7689 Persons encountering health services in other specified circumstances: Secondary | ICD-10-CM | POA: Diagnosis not present

## 2019-05-18 DIAGNOSIS — L03116 Cellulitis of left lower limb: Secondary | ICD-10-CM | POA: Diagnosis not present

## 2019-07-12 DIAGNOSIS — Z760 Encounter for issue of repeat prescription: Secondary | ICD-10-CM | POA: Diagnosis not present

## 2019-07-12 DIAGNOSIS — S62623A Displaced fracture of medial phalanx of left middle finger, initial encounter for closed fracture: Secondary | ICD-10-CM | POA: Diagnosis not present

## 2019-07-12 DIAGNOSIS — I890 Lymphedema, not elsewhere classified: Secondary | ICD-10-CM | POA: Diagnosis not present

## 2019-07-12 DIAGNOSIS — M79645 Pain in left finger(s): Secondary | ICD-10-CM | POA: Diagnosis not present

## 2019-08-16 DIAGNOSIS — Z23 Encounter for immunization: Secondary | ICD-10-CM | POA: Diagnosis not present

## 2019-08-20 DIAGNOSIS — Z6841 Body Mass Index (BMI) 40.0 and over, adult: Secondary | ICD-10-CM | POA: Diagnosis not present

## 2019-08-20 DIAGNOSIS — Z0000 Encounter for general adult medical examination without abnormal findings: Secondary | ICD-10-CM | POA: Diagnosis not present

## 2019-08-20 DIAGNOSIS — I878 Other specified disorders of veins: Secondary | ICD-10-CM | POA: Diagnosis not present

## 2019-08-20 DIAGNOSIS — I890 Lymphedema, not elsewhere classified: Secondary | ICD-10-CM | POA: Diagnosis not present

## 2019-08-20 DIAGNOSIS — G4733 Obstructive sleep apnea (adult) (pediatric): Secondary | ICD-10-CM | POA: Diagnosis not present

## 2019-09-10 DIAGNOSIS — Z7182 Exercise counseling: Secondary | ICD-10-CM | POA: Diagnosis not present

## 2019-09-10 DIAGNOSIS — Z6841 Body Mass Index (BMI) 40.0 and over, adult: Secondary | ICD-10-CM | POA: Diagnosis not present

## 2019-09-10 DIAGNOSIS — R002 Palpitations: Secondary | ICD-10-CM | POA: Diagnosis not present

## 2019-09-10 DIAGNOSIS — I890 Lymphedema, not elsewhere classified: Secondary | ICD-10-CM | POA: Diagnosis not present

## 2019-09-10 DIAGNOSIS — R609 Edema, unspecified: Secondary | ICD-10-CM | POA: Diagnosis not present

## 2019-09-10 DIAGNOSIS — Z713 Dietary counseling and surveillance: Secondary | ICD-10-CM | POA: Diagnosis not present

## 2019-09-10 DIAGNOSIS — Z13228 Encounter for screening for other metabolic disorders: Secondary | ICD-10-CM | POA: Diagnosis not present

## 2019-09-10 DIAGNOSIS — G4733 Obstructive sleep apnea (adult) (pediatric): Secondary | ICD-10-CM | POA: Diagnosis not present

## 2019-09-10 DIAGNOSIS — E8881 Metabolic syndrome: Secondary | ICD-10-CM | POA: Diagnosis not present

## 2019-09-10 DIAGNOSIS — Z136 Encounter for screening for cardiovascular disorders: Secondary | ICD-10-CM | POA: Diagnosis not present

## 2019-09-13 DIAGNOSIS — Z23 Encounter for immunization: Secondary | ICD-10-CM | POA: Diagnosis not present

## 2019-11-22 DIAGNOSIS — G4733 Obstructive sleep apnea (adult) (pediatric): Secondary | ICD-10-CM | POA: Diagnosis not present

## 2019-11-26 DIAGNOSIS — G4733 Obstructive sleep apnea (adult) (pediatric): Secondary | ICD-10-CM | POA: Diagnosis not present

## 2019-11-26 DIAGNOSIS — E8881 Metabolic syndrome: Secondary | ICD-10-CM | POA: Diagnosis not present

## 2019-11-26 DIAGNOSIS — Z7182 Exercise counseling: Secondary | ICD-10-CM | POA: Diagnosis not present

## 2019-11-26 DIAGNOSIS — Z713 Dietary counseling and surveillance: Secondary | ICD-10-CM | POA: Diagnosis not present

## 2019-11-26 DIAGNOSIS — I890 Lymphedema, not elsewhere classified: Secondary | ICD-10-CM | POA: Diagnosis not present

## 2019-11-26 DIAGNOSIS — Z6841 Body Mass Index (BMI) 40.0 and over, adult: Secondary | ICD-10-CM | POA: Diagnosis not present

## 2020-02-23 DIAGNOSIS — Z23 Encounter for immunization: Secondary | ICD-10-CM | POA: Diagnosis not present

## 2020-03-29 DIAGNOSIS — Z23 Encounter for immunization: Secondary | ICD-10-CM | POA: Diagnosis not present
